# Patient Record
Sex: Male | Born: 1965 | Race: White | Hispanic: No | Smoking: Current every day smoker
Health system: Southern US, Community
[De-identification: ages and names within clinical notes are randomized; demographics above are authoritative.]

## PROBLEM LIST (undated history)

## (undated) ENCOUNTER — Ambulatory Visit

## (undated) DIAGNOSIS — E039 Hypothyroidism, unspecified: Secondary | ICD-10-CM

## (undated) DIAGNOSIS — K219 Gastro-esophageal reflux disease without esophagitis: Secondary | ICD-10-CM

## (undated) DIAGNOSIS — E079 Disorder of thyroid, unspecified: Secondary | ICD-10-CM

## (undated) HISTORY — PX: KNEE ARTHROSCOPY: SHX127

## (undated) HISTORY — DX: Disorder of thyroid, unspecified: E07.9

## (undated) HISTORY — PX: HERNIA REPAIR: SHX51

## (undated) HISTORY — PX: KNEE RECONSTRUCTION: SHX5883

## (undated) HISTORY — PX: RECONSTRUCTION OF NOSE: SHX2301

---

## 1998-07-06 ENCOUNTER — Emergency Department (HOSPITAL_COMMUNITY): Admission: EM | Admit: 1998-07-06 | Discharge: 1998-07-06 | Payer: Self-pay | Admitting: Family Medicine

## 1998-07-17 ENCOUNTER — Encounter: Payer: Self-pay | Admitting: Orthopedic Surgery

## 1998-07-17 ENCOUNTER — Ambulatory Visit (HOSPITAL_COMMUNITY): Admission: RE | Admit: 1998-07-17 | Discharge: 1998-07-17 | Payer: Self-pay | Admitting: Orthopedic Surgery

## 2003-07-22 ENCOUNTER — Emergency Department (HOSPITAL_COMMUNITY): Admission: EM | Admit: 2003-07-22 | Discharge: 2003-07-22 | Payer: Self-pay | Admitting: Emergency Medicine

## 2004-05-14 ENCOUNTER — Emergency Department (HOSPITAL_COMMUNITY): Admission: EM | Admit: 2004-05-14 | Discharge: 2004-05-14 | Payer: Self-pay | Admitting: Emergency Medicine

## 2007-11-10 ENCOUNTER — Emergency Department (HOSPITAL_COMMUNITY): Admission: EM | Admit: 2007-11-10 | Discharge: 2007-11-10 | Payer: Self-pay | Admitting: Family Medicine

## 2012-07-07 ENCOUNTER — Encounter (INDEPENDENT_AMBULATORY_CARE_PROVIDER_SITE_OTHER): Payer: Self-pay | Admitting: General Surgery

## 2012-07-07 ENCOUNTER — Ambulatory Visit (INDEPENDENT_AMBULATORY_CARE_PROVIDER_SITE_OTHER): Payer: Self-pay | Admitting: General Surgery

## 2012-07-07 VITALS — BP 178/84 | HR 68 | Temp 97.3°F | Resp 12 | Ht 71.75 in | Wt 265.0 lb

## 2012-07-07 DIAGNOSIS — K4090 Unilateral inguinal hernia, without obstruction or gangrene, not specified as recurrent: Secondary | ICD-10-CM

## 2012-07-07 NOTE — Patient Instructions (Signed)
Plan for right inguinal hernia repair with mesh 

## 2012-07-07 NOTE — Progress Notes (Signed)
Subjective:     Patient ID: Ian Arnold, male   DOB: 10/03/1965, 46 y.o.   MRN: [id number]  HPI The patient is a 46 year old white male who felt a hernia pop out in his right groin about 8 or 10 years ago. He actually saw Dr. Orson Slick in the past but did not have insurance was not able to get it fixed. Over the last 8-10 years or hernias gradually gotten larger and more painful. He has problems with constipation and has a bowel movement about every 3 days or so. He has nausea but denies any vomiting.  Review of Systems  Constitutional: Positive for diaphoresis.  HENT: Negative.   Eyes: Negative.   Respiratory: Negative.   Cardiovascular: Negative.   Gastrointestinal: Positive for nausea and constipation.  Genitourinary: Positive for testicular pain.  Musculoskeletal: Negative.   Skin: Negative.   Neurological: Negative.   Hematological: Negative.   Psychiatric/Behavioral: Negative.        Objective:   Physical Exam  Constitutional: He is oriented to person, place, and time. He appears well-developed and well-nourished.  HENT:  Head: Normocephalic and atraumatic.  Eyes: Conjunctivae normal and EOM are normal. Pupils are equal, round, and reactive to light.  Neck: Normal range of motion. Neck supple.  Cardiovascular: Normal rate, regular rhythm and normal heart sounds.   Pulmonary/Chest: Effort normal and breath sounds normal.  Abdominal: Soft. Bowel sounds are normal.  Genitourinary:       The patient has a large right inguinal hernia that extends down into his scrotum. It is difficult to reduce and uncomfortable to palpation. His abdomen is nondistended and does not appear to be obstructed.  Musculoskeletal: Normal range of motion.  Neurological: He is alert and oriented to person, place, and time.  Skin: Skin is warm and dry.  Psychiatric: He has a normal mood and affect. His behavior is normal.       Assessment:     The patient has a large right inguinal hernia that involves  bowel and is partially incarcerated. Because of the risk of strangulation of the intestine and no obstruction I think he would benefit from having this fixed. He would also like to have this done. I discussed with him in detail the risks and benefits of the operation to fix the hernia as well as some of the technical aspects including the possibility of injuring the bile as well as possible injury to the vas and loss of the testicle and he understands and wishes to proceed    Plan:     Plan for right inguinal hernia repair with mesh

## 2012-07-08 ENCOUNTER — Other Ambulatory Visit (INDEPENDENT_AMBULATORY_CARE_PROVIDER_SITE_OTHER): Payer: Self-pay | Admitting: General Surgery

## 2012-07-08 DIAGNOSIS — K4090 Unilateral inguinal hernia, without obstruction or gangrene, not specified as recurrent: Secondary | ICD-10-CM

## 2012-07-08 MED ORDER — HYDROCODONE-ACETAMINOPHEN 5-325 MG PO TABS: 1.0000 | ORAL_TABLET | Freq: Four times a day (QID) | ORAL | Status: DC | PRN

## 2012-07-11 ENCOUNTER — Encounter (HOSPITAL_COMMUNITY): Payer: Self-pay | Admitting: Pharmacy Technician

## 2012-07-14 ENCOUNTER — Telehealth (INDEPENDENT_AMBULATORY_CARE_PROVIDER_SITE_OTHER): Payer: Self-pay | Admitting: General Surgery

## 2012-07-14 ENCOUNTER — Encounter (HOSPITAL_COMMUNITY)
Admission: RE | Admit: 2012-07-14 | Discharge: 2012-07-14 | Disposition: A | Discharge status: Home / Self Care | Payer: Self-pay | Source: Ambulatory Visit | Attending: General Surgery | Admitting: General Surgery

## 2012-07-14 ENCOUNTER — Encounter (HOSPITAL_COMMUNITY): Payer: Self-pay

## 2012-07-14 HISTORY — DX: Gastro-esophageal reflux disease without esophagitis: K21.9

## 2012-07-14 HISTORY — DX: Hypothyroidism, unspecified: E03.9

## 2012-07-14 LAB — BASIC METABOLIC PANEL
Calcium: 9.5 mg/dL (ref 8.4–10.5)
GFR calc Af Amer: >90 mL/min (ref >90)
GFR calc non Af Amer: >90 mL/min (ref >90)
Glucose, Bld: 100 mg/dL — ABNORMAL HIGH (ref 70–99)
Potassium: 4.8 mEq/L (ref 3.5–5.1)
Sodium: 141 mEq/L (ref 135–145)

## 2012-07-14 LAB — SURGICAL PCR SCREEN
MRSA, PCR: NEGATIVE
Staphylococcus aureus: NEGATIVE

## 2012-07-14 LAB — CBC
MCH: 31.2 pg (ref 26.0–34.0)
MCHC: 34.9 g/dL (ref 30.0–36.0)
Platelets: 255 10*3/uL (ref 150–400)
RBC: 4.52 MIL/uL (ref 4.22–5.81)

## 2012-07-14 NOTE — Telephone Encounter (Signed)
Pt's wife called to report that pre-admitting suggested she call our office re pain medication refill since he only had 6 tablets left until surgery on Monday. Pt does not need #30 only enough until surgery. Medication is Hydrocodone 5/325/ Pharmacy is [facility]/ [place]/ [phone]/ thanks/gy

## 2012-07-14 NOTE — Pre-Procedure Instructions (Signed)
20 TAWHID CORSO  07/14/2012   Your procedure is scheduled on:  07-18-2012  Report to [facility] at 9:00 AM.   TAKE EAST ELEVATORS TO THE 3RD FLOOR  Call this number if you have problems the morning of surgery: [phone]   Remember:   Do not eat food or drinkAfter Midnight.      Take these medicines the morning of surgery with A SIP OF WATER: pain medication if needed   Do not wear jewelry  Do not wear lotions, powders, or perfumes.  Do not shave 48 hours prior to surgery. Men may shave face and neck.  Do not bring valuables to the hospital.  Contacts, dentures or bridgework may not be worn into surgery.  Leave suitcase in the car. After surgery it may be brought to your room.   For patients admitted to the hospital, checkout time is 11:00 AM the day of discharge.   Patients discharged the day of surgery will not be allowed to drive home.  Name and phone number of your driver: ____________________    Special Instructions: Shower using CHG 2 nights before surgery and the night before surgery.  If you shower the day of surgery use CHG.  Use special wash - you have one bottle of CHG for all showers.  You should use approximately 1/3 of the bottle for each shower.     Please read over the following fact sheets that you were given: Pain Booklet, Coughing and Deep Breathing, MRSA Information and Surgical Site Infection Prevention

## 2012-07-15 ENCOUNTER — Telehealth (INDEPENDENT_AMBULATORY_CARE_PROVIDER_SITE_OTHER): Payer: Self-pay | Admitting: General Surgery

## 2012-07-15 ENCOUNTER — Other Ambulatory Visit (INDEPENDENT_AMBULATORY_CARE_PROVIDER_SITE_OTHER): Payer: Self-pay | Admitting: General Surgery

## 2012-07-15 DIAGNOSIS — K4090 Unilateral inguinal hernia, without obstruction or gangrene, not specified as recurrent: Secondary | ICD-10-CM

## 2012-07-15 MED ORDER — HYDROCODONE-ACETAMINOPHEN 5-325 MG PO TABS: 1.0000 | ORAL_TABLET | Freq: Four times a day (QID) | ORAL | Status: DC | PRN

## 2012-07-15 NOTE — Telephone Encounter (Signed)
He can have 20 more norcos

## 2012-07-15 NOTE — Telephone Encounter (Signed)
LMOM for refill of NORCO 5-325mg  1 tablet Q6H prn, #20 no refills.

## 2012-07-17 MED ORDER — CHLORHEXIDINE GLUCONATE 4 % EX LIQD: 1.0000 "application " | Freq: Once | CUTANEOUS | Status: DC

## 2012-07-17 MED ORDER — DEXTROSE 5 % IV SOLN
3.0000 g | INTRAVENOUS | Status: AC
  Administered 2012-07-18: 3 g via INTRAVENOUS
  Filled 2012-07-17: qty 3000

## 2012-07-18 ENCOUNTER — Encounter (HOSPITAL_COMMUNITY): Payer: Self-pay | Admitting: Anesthesiology

## 2012-07-18 ENCOUNTER — Encounter (HOSPITAL_COMMUNITY)
Admission: RE | Disposition: A | Discharge status: Home / Self Care | Payer: Self-pay | Source: Ambulatory Visit | Attending: General Surgery

## 2012-07-18 ENCOUNTER — Ambulatory Visit (HOSPITAL_COMMUNITY)
Admission: RE | Admit: 2012-07-18 | Discharge: 2012-07-18 | Disposition: A | Discharge status: Home / Self Care | Payer: Self-pay | Source: Ambulatory Visit | Attending: General Surgery | Admitting: General Surgery

## 2012-07-18 ENCOUNTER — Encounter (HOSPITAL_COMMUNITY): Payer: Self-pay | Admitting: *Deleted

## 2012-07-18 ENCOUNTER — Ambulatory Visit (HOSPITAL_COMMUNITY): Payer: Self-pay | Admitting: Anesthesiology

## 2012-07-18 DIAGNOSIS — K4090 Unilateral inguinal hernia, without obstruction or gangrene, not specified as recurrent: Secondary | ICD-10-CM | POA: Insufficient documentation

## 2012-07-18 DIAGNOSIS — Z01812 Encounter for preprocedural laboratory examination: Secondary | ICD-10-CM | POA: Insufficient documentation

## 2012-07-18 HISTORY — PX: INGUINAL HERNIA REPAIR: SHX194

## 2012-07-18 HISTORY — PX: INSERTION OF MESH: SHX5868

## 2012-07-18 SURGERY — REPAIR, HERNIA, INGUINAL, ADULT
Anesthesia: General | Site: Abdomen | Laterality: Right | Wound class: Clean

## 2012-07-18 MED ORDER — MORPHINE SULFATE 2 MG/ML IJ SOLN: 2.0000 mg | Freq: Once | INTRAMUSCULAR | Status: DC

## 2012-07-18 MED ORDER — PROMETHAZINE HCL 25 MG/ML IJ SOLN
12.5000 mg | Freq: Once | INTRAMUSCULAR | Status: DC
  Filled 2012-07-18: qty 1

## 2012-07-18 MED ORDER — MORPHINE SULFATE 4 MG/ML IJ SOLN
INTRAMUSCULAR | Status: AC
  Administered 2012-07-18: 2 mg
  Filled 2012-07-18: qty 1

## 2012-07-18 MED ORDER — ROCURONIUM BROMIDE 100 MG/10ML IV SOLN
INTRAVENOUS | Status: DC | PRN
  Administered 2012-07-18: 50 mg via INTRAVENOUS

## 2012-07-18 MED ORDER — ONDANSETRON HCL 4 MG/2ML IJ SOLN
INTRAMUSCULAR | Status: DC | PRN
  Administered 2012-07-18: 4 mg via INTRAVENOUS

## 2012-07-18 MED ORDER — HYDROMORPHONE HCL PF 1 MG/ML IJ SOLN
INTRAMUSCULAR | Status: AC
  Filled 2012-07-18: qty 1

## 2012-07-18 MED ORDER — FENTANYL CITRATE 0.05 MG/ML IJ SOLN
INTRAMUSCULAR | Status: DC | PRN
  Administered 2012-07-18: 100 ug via INTRAVENOUS
  Administered 2012-07-18 (×3): 50 ug via INTRAVENOUS

## 2012-07-18 MED ORDER — PROPOFOL 10 MG/ML IV BOLUS
INTRAVENOUS | Status: DC | PRN
  Administered 2012-07-18: 280 mg via INTRAVENOUS

## 2012-07-18 MED ORDER — 0.9 % SODIUM CHLORIDE (POUR BTL) OPTIME
TOPICAL | Status: DC | PRN
  Administered 2012-07-18: 1000 mL

## 2012-07-18 MED ORDER — GLYCOPYRROLATE 0.2 MG/ML IJ SOLN
INTRAMUSCULAR | Status: DC | PRN
  Administered 2012-07-18: 0.2 mg via INTRAVENOUS
  Administered 2012-07-18: 0.4 mg via INTRAVENOUS

## 2012-07-18 MED ORDER — ONDANSETRON HCL 4 MG/2ML IJ SOLN: 4.0000 mg | Freq: Once | INTRAMUSCULAR | Status: DC | PRN

## 2012-07-18 MED ORDER — LACTATED RINGERS IV SOLN
INTRAVENOUS | Status: DC | PRN
  Administered 2012-07-18 (×2): via INTRAVENOUS

## 2012-07-18 MED ORDER — HYDROMORPHONE HCL PF 1 MG/ML IJ SOLN
0.2500 mg | INTRAMUSCULAR | Status: DC | PRN
  Administered 2012-07-18: 0.5 mg via INTRAVENOUS

## 2012-07-18 MED ORDER — NEOSTIGMINE METHYLSULFATE 1 MG/ML IJ SOLN
INTRAMUSCULAR | Status: DC | PRN
  Administered 2012-07-18: 3 mg via INTRAVENOUS

## 2012-07-18 MED ORDER — LIDOCAINE HCL (CARDIAC) 20 MG/ML IV SOLN
INTRAVENOUS | Status: DC | PRN
  Administered 2012-07-18: 90 mg via INTRAVENOUS

## 2012-07-18 MED ORDER — LACTATED RINGERS IV SOLN
INTRAVENOUS | Status: DC
  Administered 2012-07-18: 10:00:00 via INTRAVENOUS

## 2012-07-18 MED ORDER — BUPIVACAINE-EPINEPHRINE PF 0.25-1:200000 % IJ SOLN
INTRAMUSCULAR | Status: AC
  Filled 2012-07-18: qty 30

## 2012-07-18 MED ORDER — VECURONIUM BROMIDE 10 MG IV SOLR
INTRAVENOUS | Status: DC | PRN
  Administered 2012-07-18: 2 mg via INTRAVENOUS

## 2012-07-18 MED ORDER — HYDROCODONE-ACETAMINOPHEN 5-325 MG PO TABS: 1.0000 | ORAL_TABLET | ORAL | Status: DC | PRN

## 2012-07-18 MED ORDER — BUPIVACAINE-EPINEPHRINE 0.25% -1:200000 IJ SOLN
INTRAMUSCULAR | Status: DC | PRN
  Administered 2012-07-18: 20 mL

## 2012-07-18 SURGICAL SUPPLY — 48 items
ADH SKN CLS APL DERMABOND .7 (GAUZE/BANDAGES/DRESSINGS) ×2
BLADE SURG 10 STRL SS (BLADE) ×3 IMPLANT
BLADE SURG 15 STRL LF DISP TIS (BLADE) ×2 IMPLANT
BLADE SURG 15 STRL SS (BLADE) ×3
BLADE SURG ROTATE 9660 (MISCELLANEOUS) ×1 IMPLANT
CHLORAPREP W/TINT 26ML (MISCELLANEOUS) ×3 IMPLANT
CLOTH BEACON ORANGE TIMEOUT ST (SAFETY) ×3 IMPLANT
COVER SURGICAL LIGHT HANDLE (MISCELLANEOUS) ×3 IMPLANT
DECANTER SPIKE VIAL GLASS SM (MISCELLANEOUS) ×3 IMPLANT
DERMABOND ADVANCED (GAUZE/BANDAGES/DRESSINGS) ×1
DERMABOND ADVANCED .7 DNX12 (GAUZE/BANDAGES/DRESSINGS) ×2 IMPLANT
DRAIN PENROSE 1/2X12 LTX STRL (WOUND CARE) ×1 IMPLANT
DRAPE LAPAROSCOPIC ABDOMINAL (DRAPES) ×3 IMPLANT
DRAPE UTILITY 15X26 W/TAPE STR (DRAPE) ×6 IMPLANT
ELECT CAUTERY BLADE 6.4 (BLADE) ×3 IMPLANT
ELECT REM PT RETURN 9FT ADLT (ELECTROSURGICAL) ×3
ELECTRODE REM PT RTRN 9FT ADLT (ELECTROSURGICAL) ×2 IMPLANT
GLOVE BIO SURGEON STRL SZ7.5 (GLOVE) ×6 IMPLANT
GLOVE BIOGEL PI IND STRL 7.5 (GLOVE) IMPLANT
GLOVE BIOGEL PI INDICATOR 7.5 (GLOVE) ×3
GOWN STRL NON-REIN LRG LVL3 (GOWN DISPOSABLE) ×8 IMPLANT
KIT BASIN OR (CUSTOM PROCEDURE TRAY) ×3 IMPLANT
KIT ROOM TURNOVER OR (KITS) ×3 IMPLANT
MESH ULTRAPRO 3X6 7.6X15CM (Mesh General) ×1 IMPLANT
NDL HYPO 25GX1X1/2 BEV (NEEDLE) ×2 IMPLANT
NEEDLE HYPO 25GX1X1/2 BEV (NEEDLE) ×3 IMPLANT
NS IRRIG 1000ML POUR BTL (IV SOLUTION) ×3 IMPLANT
PACK SURGICAL SETUP 50X90 (CUSTOM PROCEDURE TRAY) ×3 IMPLANT
PAD ARMBOARD 7.5X6 YLW CONV (MISCELLANEOUS) ×6 IMPLANT
PENCIL BUTTON HOLSTER BLD 10FT (ELECTRODE) ×3 IMPLANT
SPONGE LAP 18X18 X RAY DECT (DISPOSABLE) ×3 IMPLANT
SUT MNCRL AB 4-0 PS2 18 (SUTURE) ×3 IMPLANT
SUT PROLENE 2 0 SH DA (SUTURE) ×8 IMPLANT
SUT SILK 2 0 SH (SUTURE) IMPLANT
SUT SILK 3 0 (SUTURE) ×3
SUT SILK 3-0 18XBRD TIE 12 (SUTURE) ×2 IMPLANT
SUT VIC AB 0 CT1 27 (SUTURE) ×3
SUT VIC AB 0 CT1 27XBRD ANBCTR (SUTURE) ×2 IMPLANT
SUT VIC AB 2-0 SH 27 (SUTURE) ×6
SUT VIC AB 2-0 SH 27X BRD (SUTURE) ×2 IMPLANT
SUT VIC AB 2-0 SH 27XBRD (SUTURE) IMPLANT
SUT VIC AB 3-0 SH 27 (SUTURE) ×3
SUT VIC AB 3-0 SH 27XBRD (SUTURE) ×2 IMPLANT
SYR BULB 3OZ (MISCELLANEOUS) ×3 IMPLANT
SYR CONTROL 10ML LL (SYRINGE) ×3 IMPLANT
TOWEL OR 17X24 6PK STRL BLUE (TOWEL DISPOSABLE) ×3 IMPLANT
TOWEL OR 17X26 10 PK STRL BLUE (TOWEL DISPOSABLE) ×3 IMPLANT
WATER STERILE IRR 1000ML POUR (IV SOLUTION) IMPLANT

## 2012-07-18 NOTE — Op Note (Signed)
07/18/2012  12:44 PM  PATIENT:  Ian Arnold  46 y.o. male  PRE-OPERATIVE DIAGNOSIS:  right inguinal hernia  POST-OPERATIVE DIAGNOSIS:  right inguinal hernia  PROCEDURE:  Procedure(s) (LRB) with comments: HERNIA REPAIR INGUINAL ADULT (Right) INSERTION OF MESH (N/A)  SURGEON:  Surgeon(s) and Role:    * Robyne Askew, MD - Primary  PHYSICIAN ASSISTANT:   ASSISTANTS: none   ANESTHESIA:   general  EBL:  Total I/O In: 1100 [I.V.:1100] Out: 50 [Blood:50]  BLOOD ADMINISTERED:none  DRAINS: none   LOCAL MEDICATIONS USED:  MARCAINE     SPECIMEN:  No Specimen  DISPOSITION OF SPECIMEN:  N/A  COUNTS:  YES  TOURNIQUET:  * No tourniquets in log *  DICTATION: .Dragon Dictation After informed consent was obtained the patient was brought to the operating room placed in the supine position on the operating room table. After adequate induction of general anesthesia the patient's right groin was prepped with ChloraPrep, allowed to dry, and draped in usual sterile manner. The right groin was then infiltrated with quarter percent Marcaine. An incision was made from the edge of the pubic tubercle on the right towards the anterior superior iliac spine with a 15 blade knife. This incision was carried through the skin and subcutaneous tissue sharply with the electrocautery until the fascia of the external oblique was encountered. A small bridging vein was clamped with hemostats divided and ligated with 3-0 silk ties. The patient had a very large hernia that extended down into the scrotum. This was able to be reduced prior to starting the case. The external oblique fascia was then opened along its fibers towards the apex of the external ring with a 15 blade knife and Metzenbaum scissors. A wheatland retractor was deployed. Blunt dissection was carried out over the cord structures and the hernia until they could be surrounded between 2 fingers. The ilioinguinal nerve was identified and involved with  scar tissue. It was dissected free proximally and distally clamped with a hemostat divided and ligated with 3-0 silk ties. The large hernia sac was gently separated from the rest of the cord structures. The sac was very large and basically obliterated the whole floor of the canal. Because we were unsure if there was colon within the hernia sac we elected to reduce it. We chose a 3 x 6 piece of ultra pro mesh. The mesh was cut to fit the hernia defect. The mesh was sewed inferiorly to the shelving edge of the inguinal ligament with a running 2-0 Prolene stitch. Superiorly the mesh was sewed to the musculoaponeurotic strength layer of the transversalis with interrupted 2-0 Prolene vertical mattress stitches. Tails were cut in the mesh laterally and the tails were wrapped around the cord structures. Lateral to the cord the tails of the mesh were anchored to the shelving edge of the inguinal ligament with an interrupted 2-0 Prolene stitch. Once this was accomplished the mesh appeared to be in good position and the hernia appeared to be well repaired. The wound was irrigated with copious amounts of saline. The external oblique fascia was then reapproximated with a running 2-0 Vicryl stitch. The subcutaneous fascia was also reapproximated with a running 2-0 Vicryl stitch. The skin was then closed with a running 4-0 Monocryl subcuticular stitch. Dermabond dressings were applied. The patient tolerated the procedure well. At the end of the case all needle sponge and instrument counts were correct. The patient was then awakened and taken to recovery in stable condition. The patient's  testicles in the scrotum at the end of the case.  PLAN OF CARE: Discharge to home after PACU  PATIENT DISPOSITION:  PACU - hemodynamically stable.   Delay start of Pharmacological VTE agent (>24hrs) due to surgical blood loss or risk of bleeding: not applicable

## 2012-07-18 NOTE — Progress Notes (Signed)
Pt reports instance of feeling like" throat closes". No swelling or redness noted.  Dr. Feliberto Gottron, Anesthesiologist notified.

## 2012-07-18 NOTE — H&P (View-Only) (Signed)
Subjective:     Patient ID: Ian Arnold, male   DOB: 08/28/1966, 46 y.o.   MRN: [id number]  HPI The patient is a 46-year-old white male who felt a hernia pop out in his right groin about 8 or 10 years ago. He actually saw Dr. Bowman in the past but did not have insurance was not able to get it fixed. Over the last 8-10 years or hernias gradually gotten larger and more painful. He has problems with constipation and has a bowel movement about every 3 days or so. He has nausea but denies any vomiting.  Review of Systems  Constitutional: Positive for diaphoresis.  HENT: Negative.   Eyes: Negative.   Respiratory: Negative.   Cardiovascular: Negative.   Gastrointestinal: Positive for nausea and constipation.  Genitourinary: Positive for testicular pain.  Musculoskeletal: Negative.   Skin: Negative.   Neurological: Negative.   Hematological: Negative.   Psychiatric/Behavioral: Negative.        Objective:   Physical Exam  Constitutional: He is oriented to person, place, and time. He appears well-developed and well-nourished.  HENT:  Head: Normocephalic and atraumatic.  Eyes: Conjunctivae normal and EOM are normal. Pupils are equal, round, and reactive to light.  Neck: Normal range of motion. Neck supple.  Cardiovascular: Normal rate, regular rhythm and normal heart sounds.   Pulmonary/Chest: Effort normal and breath sounds normal.  Abdominal: Soft. Bowel sounds are normal.  Genitourinary:       The patient has a large right inguinal hernia that extends down into his scrotum. It is difficult to reduce and uncomfortable to palpation. His abdomen is nondistended and does not appear to be obstructed.  Musculoskeletal: Normal range of motion.  Neurological: He is alert and oriented to person, place, and time.  Skin: Skin is warm and dry.  Psychiatric: He has a normal mood and affect. His behavior is normal.       Assessment:     The patient has a large right inguinal hernia that involves  bowel and is partially incarcerated. Because of the risk of strangulation of the intestine and no obstruction I think he would benefit from having this fixed. He would also like to have this done. I discussed with him in detail the risks and benefits of the operation to fix the hernia as well as some of the technical aspects including the possibility of injuring the bile as well as possible injury to the vas and loss of the testicle and he understands and wishes to proceed    Plan:     Plan for right inguinal hernia repair with mesh      

## 2012-07-18 NOTE — Interval H&P Note (Signed)
History and Physical Interval Note:  07/18/2012 10:12 AM  Ian Arnold  has presented today for surgery, with the diagnosis of right  inguinal hernia  The various methods of treatment have been discussed with the patient and family. After consideration of risks, benefits and other options for treatment, the patient has consented to  Procedure(s) (LRB) with comments: HERNIA REPAIR INGUINAL ADULT (Right) INSERTION OF MESH (N/A) as a surgical intervention .  The patient's history has been reviewed, patient examined, no change in status, stable for surgery.  I have reviewed the patient's chart and labs.  Questions were answered to the patient's satisfaction.     TOTH III,PAUL S

## 2012-07-18 NOTE — Anesthesia Procedure Notes (Signed)
Procedure Name: Intubation Date/Time: 07/18/2012 10:56 AM Performed by: Jerilee Hoh Pre-anesthesia Checklist: Patient identified, Emergency Drugs available, Suction available and Patient being monitored Patient Re-evaluated:Patient Re-evaluated prior to inductionOxygen Delivery Method: Circle system utilized Preoxygenation: Pre-oxygenation with 100% oxygen Intubation Type: IV induction Ventilation: Mask ventilation without difficulty and Oral airway inserted - appropriate to patient size Laryngoscope Size: Mac and 4 Grade View: Grade II Tube type: Oral Tube size: 7.5 mm Number of attempts: 1 Airway Equipment and Method: Stylet Placement Confirmation: ETT inserted through vocal cords under direct vision,  positive ETCO2 and breath sounds checked- equal and bilateral Secured at: 23 cm Tube secured with: Tape Dental Injury: Teeth and Oropharynx as per pre-operative assessment

## 2012-07-18 NOTE — Anesthesia Postprocedure Evaluation (Signed)
  Anesthesia Post-op Note  Patient: Ian Arnold  Procedure(s) Performed: Procedure(s) (LRB) with comments: HERNIA REPAIR INGUINAL ADULT (Right) INSERTION OF MESH (N/A)  Patient Location: PACU  Anesthesia Type:General  Level of Consciousness: awake, alert , oriented and patient cooperative  Airway and Oxygen Therapy: Patient Spontanous Breathing  Post-op Pain: mild  Post-op Assessment: Post-op Vital signs reviewed, Patient's Cardiovascular Status Stable, Respiratory Function Stable, Patent Airway, No signs of Nausea or vomiting and Pain level controlled  Post-op Vital Signs: stable  Complications: No apparent anesthesia complications

## 2012-07-18 NOTE — Transfer of Care (Signed)
Immediate Anesthesia Transfer of Care Note  Patient: Ian Arnold  Procedure(s) Performed: Procedure(s) (LRB) with comments: HERNIA REPAIR INGUINAL ADULT (Right) INSERTION OF MESH (N/A)  Patient Location: PACU  Anesthesia Type:General  Level of Consciousness: awake, alert , oriented and patient cooperative  Airway & Oxygen Therapy: Patient Spontanous Breathing and Patient connected to nasal cannula oxygen  Post-op Assessment: Report given to PACU RN, Post -op Vital signs reviewed and stable and Patient moving all extremities  Post vital signs: Reviewed and stable  Complications: No apparent anesthesia complications

## 2012-07-18 NOTE — Preoperative (Signed)
Beta Blockers   Reason not to administer Beta Blockers:Not Applicable 

## 2012-07-18 NOTE — Anesthesia Preprocedure Evaluation (Signed)
Anesthesia Evaluation  Patient identified by MRN, date of birth, ID band Patient awake    Reviewed: Allergy & Precautions, H&P , NPO status , Patient's Chart, lab work & pertinent test results  Airway Mallampati: I TM Distance: >3 FB Neck ROM: full    Dental   Pulmonary former smoker,          Cardiovascular Exercise Tolerance: Good Rhythm:regular Rate:Normal     Neuro/Psych    GI/Hepatic GERD-  ,  Endo/Other  Hypothyroidism   Renal/GU      Musculoskeletal   Abdominal   Peds  Hematology   Anesthesia Other Findings   Reproductive/Obstetrics                           Anesthesia Physical Anesthesia Plan  ASA: I  Anesthesia Plan: General   Post-op Pain Management:    Induction: Intravenous  Airway Management Planned: LMA and Oral ETT  Additional Equipment:   Intra-op Plan:   Post-operative Plan: Extubation in OR  Informed Consent: I have reviewed the patients History and Physical, chart, labs and discussed the procedure including the risks, benefits and alternatives for the proposed anesthesia with the patient or authorized representative who has indicated his/her understanding and acceptance.     Plan Discussed with: CRNA, Anesthesiologist and Surgeon  Anesthesia Plan Comments:         Anesthesia Quick Evaluation

## 2012-07-19 ENCOUNTER — Encounter (HOSPITAL_COMMUNITY): Payer: Self-pay | Admitting: General Surgery

## 2012-07-22 ENCOUNTER — Telehealth (INDEPENDENT_AMBULATORY_CARE_PROVIDER_SITE_OTHER): Payer: Self-pay | Admitting: General Surgery

## 2012-07-22 NOTE — Telephone Encounter (Signed)
Pt called to ask about swelling of scrotum and penis following RIH.  Reassured pt that while unsightly, the swelling is expected.  Reminded him to use ice packs and elevate external genitalia to promote drainage of the tissue fluids.  Gave pt his follow up appt for Tues, 08/02/12 at 11:00.  He understands.

## 2012-08-02 ENCOUNTER — Ambulatory Visit (INDEPENDENT_AMBULATORY_CARE_PROVIDER_SITE_OTHER): Payer: Self-pay | Admitting: General Surgery

## 2012-08-02 ENCOUNTER — Encounter (INDEPENDENT_AMBULATORY_CARE_PROVIDER_SITE_OTHER): Payer: Self-pay | Admitting: General Surgery

## 2012-08-02 VITALS — BP 162/96 | HR 76 | Temp 97.8°F | Resp 18 | Ht 71.0 in | Wt 264.2 lb

## 2012-08-02 DIAGNOSIS — K4090 Unilateral inguinal hernia, without obstruction or gangrene, not specified as recurrent: Secondary | ICD-10-CM

## 2012-08-02 MED ORDER — NYSTATIN 100000 UNIT/GM EX CREA: TOPICAL_CREAM | Freq: Two times a day (BID) | CUTANEOUS | Status: DC

## 2012-08-02 NOTE — Progress Notes (Signed)
Subjective:     Patient ID: Ian Arnold, male   DOB: February 25, 1966, 46 y.o.   MRN: [id number]  HPI The patient is a 46 year old white male who is 2 weeks status post right inguinal hernia repair with mesh. He had a huge hernia. He is still having a lot of pain in the right groin area. He also complains of itching in the right groin area. He definitely feels better than he did prior to surgery. His appetite is good and his bowels are beginning to work more normally now.  Review of Systems     Objective:   Physical Exam On exam his right inguinal incision is healing nicely with no sign of infection. He has a normal healing ridge. He does have some swelling in the scrotum as well which is not expected given the size of his hernia. There is no palpable evidence for recurrence of the hernia. He also has a fungal infection in his right groin crease.    Assessment:     2 weeks status post right inguinal hernia repair with mesh    Plan:     At this point I would like him to continue to refrain from any heavy lifting. I will refill his pain medicine the day. I will also prescribe some nystatin cream for the infection in the right groin. We will plan to see him back in one month to check his progress

## 2012-08-02 NOTE — Patient Instructions (Signed)
No heavy lifting Nystatin cream to rash 2-3 times a day

## 2012-08-12 ENCOUNTER — Other Ambulatory Visit (INDEPENDENT_AMBULATORY_CARE_PROVIDER_SITE_OTHER): Payer: Self-pay | Admitting: General Surgery

## 2012-08-12 ENCOUNTER — Encounter (INDEPENDENT_AMBULATORY_CARE_PROVIDER_SITE_OTHER): Payer: Self-pay | Admitting: General Surgery

## 2012-08-12 ENCOUNTER — Ambulatory Visit (INDEPENDENT_AMBULATORY_CARE_PROVIDER_SITE_OTHER): Payer: Self-pay | Admitting: General Surgery

## 2012-08-12 VITALS — BP 128/82 | HR 76 | Temp 98.0°F | Resp 12 | Ht 71.0 in | Wt 264.2 lb

## 2012-08-12 DIAGNOSIS — N5089 Other specified disorders of the male genital organs: Secondary | ICD-10-CM

## 2012-08-12 DIAGNOSIS — N50812 Left testicular pain: Secondary | ICD-10-CM

## 2012-08-12 DIAGNOSIS — N50811 Right testicular pain: Secondary | ICD-10-CM

## 2012-08-12 MED ORDER — HYDROCODONE-ACETAMINOPHEN 5-325 MG PO TABS: 1.0000 | ORAL_TABLET | ORAL | Status: DC | PRN

## 2012-08-12 NOTE — Progress Notes (Signed)
Subjective:   Patient ID: Ian Arnold, male DOB: 29-May-1966, 46 y.o. MRN: [id number]  HPI  The patient is a 46 year old white male who is ~3 weeks status post right inguinal hernia repair with mesh. He had a large hernia per Dr Carolynne Edouard. He is still having a lot of pain and swelling in the right groin area.  He trying to elevate the testicle but the pain is still there.    Review of Systems  Objective:   Physical Exam  Filed Vitals:   08/12/12 1635  BP: 128/82  Pulse: 76  Temp: 98 F (36.7 C)  Resp: 12   On exam his right inguinal incision is healing with no sign of infection. He does have some swelling in his R testicle.  It is about 3 times as large and the left and tender to touch    Assessment:   3 weeks status post right inguinal hernia repair with mesh with a swollen R testicle- presumed venous drainage issue.  Pain and swelling not acute therefore these will be scheduled urgently but not emergently. Scrotal US and follow up with Urology Cont to elevate scrotum Keep apt to see Dr Carolynne Edouard in a couple weeks

## 2012-08-12 NOTE — Patient Instructions (Addendum)
You have a swollen right testicle.  I have recommended a scrotal ultrasound and to see a urologist

## 2012-09-06 ENCOUNTER — Ambulatory Visit (INDEPENDENT_AMBULATORY_CARE_PROVIDER_SITE_OTHER): Payer: Self-pay | Admitting: General Surgery

## 2012-09-06 ENCOUNTER — Encounter (INDEPENDENT_AMBULATORY_CARE_PROVIDER_SITE_OTHER): Payer: Self-pay | Admitting: General Surgery

## 2012-09-06 VITALS — BP 152/70 | HR 76 | Temp 98.3°F | Resp 20 | Ht 72.0 in | Wt 262.0 lb

## 2012-09-06 DIAGNOSIS — K4090 Unilateral inguinal hernia, without obstruction or gangrene, not specified as recurrent: Secondary | ICD-10-CM

## 2012-09-06 NOTE — Patient Instructions (Signed)
May return to all normal activities 

## 2012-09-06 NOTE — Progress Notes (Signed)
Subjective:     Patient ID: Ian Arnold, male   DOB: [birth year], 47 y.o.   MRN: [id number]  HPI The patient is a 47 year old white male who is about 6 weeks status post right inguinal hernia repair with mesh. He had a very large hernia that extended into his scrotum. His only complaint today is of some swelling and soreness around the right testicle. Overall though this is vastly improved from what he was experiencing preoperatively.  Review of Systems     Objective:   Physical Exam On exam his right groin incision is healing nicely with no sign of infection. He has no palpable evidence for recurrence of the hernia. He has some mild fullness around the right testicle but this is actually much less than what I expected. The tenderness around the right testicle was very mild.    Assessment:     6 weeks status post right inguinal hernia repair with mesh    Plan:     At this point I believe he can return to his normal activities without any restrictions. I'll plan to see him back in one month to recheck his scrotum. If he continues to have some soreness we can get an ultrasound of the area but I think this is all normal postoperative findings at this point

## 2012-10-04 ENCOUNTER — Encounter (INDEPENDENT_AMBULATORY_CARE_PROVIDER_SITE_OTHER): Payer: Self-pay | Admitting: General Surgery

## 2012-10-04 ENCOUNTER — Ambulatory Visit (INDEPENDENT_AMBULATORY_CARE_PROVIDER_SITE_OTHER): Payer: Self-pay | Admitting: General Surgery

## 2012-10-04 VITALS — BP 126/80 | HR 61 | Temp 97.0°F | Resp 18 | Ht 71.38 in | Wt 262.4 lb

## 2012-10-04 DIAGNOSIS — K4090 Unilateral inguinal hernia, without obstruction or gangrene, not specified as recurrent: Secondary | ICD-10-CM

## 2012-10-04 NOTE — Progress Notes (Signed)
Subjective:     Patient ID: Ian Arnold, male   DOB: Aug 26, 1966, 47 y.o.   MRN: [id number]  HPI The patient is a 47 year old white male who is about 6 weeks status post repair of a giant right inguinal hernia with mesh. He is still having a little bit of burning sensation at the lateral portion of his incision but this is improving. His appetite is good and his bowels are working normally. The swelling around his right testicle is significantly reduced.  Review of Systems     Objective:   Physical Exam On exam his right inguinal incision is healing nicely with no sign of infection or seroma. There is no palpable evidence for recurrence of the hernia. The swelling around the testicle is significantly reduced.    Assessment:     The patient is 6 weeks status post repair of a giant right inguinal hernia    Plan:     At this point I think he can start returning to his normal activities without restrictions. We will plan to see him back in a couple more months to check his progress

## 2012-10-04 NOTE — Patient Instructions (Signed)
May return to normal activities 

## 2012-12-05 ENCOUNTER — Encounter (INDEPENDENT_AMBULATORY_CARE_PROVIDER_SITE_OTHER): Payer: Self-pay | Admitting: General Surgery

## 2012-12-05 ENCOUNTER — Ambulatory Visit (INDEPENDENT_AMBULATORY_CARE_PROVIDER_SITE_OTHER): Payer: Self-pay | Admitting: General Surgery

## 2012-12-05 VITALS — BP 144/82 | HR 58 | Temp 98.2°F | Resp 18 | Ht 71.5 in | Wt 259.8 lb

## 2012-12-05 DIAGNOSIS — K4090 Unilateral inguinal hernia, without obstruction or gangrene, not specified as recurrent: Secondary | ICD-10-CM

## 2012-12-05 NOTE — Patient Instructions (Signed)
May return to all normal activities 

## 2012-12-05 NOTE — Progress Notes (Signed)
Subjective:     Patient ID: Ian Arnold, male   DOB: 1966/02/25, 47 y.o.   MRN: [id number]  HPI The patient is a 47 year old white male who is 3 months status post repair of a giant right inguinal hernia with mesh. At this point he is doing very well. The swelling in his testicle has resolved. He denies any groin pain. Appetite is good and his bowels are working normally.  Review of Systems     Objective:   Physical Exam On exam his right groin incision is healed nicely with no sign of infection or seroma. There is no palpable evidence for recurrence of the hernia.    Assessment:     The patient is 3 months status post right inguinal hernia repair with mesh     Plan:     At this point he may return all his normal activities without any restrictions. We will plan to see him back on a when necessary basis

## 2014-09-22 ENCOUNTER — Emergency Department (HOSPITAL_COMMUNITY): Payer: Self-pay

## 2014-09-22 ENCOUNTER — Emergency Department (HOSPITAL_COMMUNITY)
Admission: EM | Admit: 2014-09-22 | Discharge: 2014-09-22 | Disposition: A | Discharge status: Home / Self Care | Payer: Self-pay | Attending: Emergency Medicine | Admitting: Emergency Medicine

## 2014-09-22 ENCOUNTER — Encounter (HOSPITAL_COMMUNITY): Payer: Self-pay | Admitting: *Deleted

## 2014-09-22 DIAGNOSIS — Y9289 Other specified places as the place of occurrence of the external cause: Secondary | ICD-10-CM | POA: Insufficient documentation

## 2014-09-22 DIAGNOSIS — Z87891 Personal history of nicotine dependence: Secondary | ICD-10-CM | POA: Insufficient documentation

## 2014-09-22 DIAGNOSIS — M25531 Pain in right wrist: Secondary | ICD-10-CM

## 2014-09-22 DIAGNOSIS — Z8719 Personal history of other diseases of the digestive system: Secondary | ICD-10-CM | POA: Insufficient documentation

## 2014-09-22 DIAGNOSIS — S62144A Nondisplaced fracture of body of hamate [unciform] bone, right wrist, initial encounter for closed fracture: Secondary | ICD-10-CM | POA: Insufficient documentation

## 2014-09-22 DIAGNOSIS — Y998 Other external cause status: Secondary | ICD-10-CM | POA: Insufficient documentation

## 2014-09-22 DIAGNOSIS — R52 Pain, unspecified: Secondary | ICD-10-CM

## 2014-09-22 DIAGNOSIS — W1839XA Other fall on same level, initial encounter: Secondary | ICD-10-CM | POA: Insufficient documentation

## 2014-09-22 DIAGNOSIS — S62141A Displaced fracture of body of hamate [unciform] bone, right wrist, initial encounter for closed fracture: Secondary | ICD-10-CM

## 2014-09-22 DIAGNOSIS — Z8639 Personal history of other endocrine, nutritional and metabolic disease: Secondary | ICD-10-CM | POA: Insufficient documentation

## 2014-09-22 DIAGNOSIS — Y9389 Activity, other specified: Secondary | ICD-10-CM | POA: Insufficient documentation

## 2014-09-22 MED ORDER — TRAMADOL HCL 50 MG PO TABS: 50.0000 mg | ORAL_TABLET | Freq: Four times a day (QID) | ORAL | Status: DC | PRN

## 2014-09-22 MED ORDER — ACETAMINOPHEN 325 MG PO TABS
650.0000 mg | ORAL_TABLET | Freq: Once | ORAL | Status: AC
  Administered 2014-09-22: 650 mg via ORAL
  Filled 2014-09-22: qty 2

## 2014-09-22 NOTE — ED Notes (Signed)
Pt reports falling today and has pain to right hand

## 2014-09-22 NOTE — ED Notes (Signed)
Ortho Tech in w/pt. 

## 2014-09-22 NOTE — Discharge Instructions (Signed)
Take pain medication as needed.  Do not drive or operate heavy machinery for 4-6 hours after taking medication. °

## 2014-09-22 NOTE — ED Provider Notes (Signed)
CSN: [id number]     Arrival date & time 09/22/14  1415 History  This chart was scribed for non-physician practitioner, Santiago GladHeather Kolina Kube, PA-C working with Flint MelterElliott L Wentz, MD by Luisa DagoPriscilla Tutu, ED scribe. This patient was seen in room TR11C/TR11C and the patient's care was started at 2:50 PM.     Chief Complaint  Patient presents with  . Fall  . Hand Pain   The history is provided by the patient and medical records. No language interpreter was used.   HPI Comments: Ian Arnold is a 49 y.o. male who presents to the Emergency Department complaining of a fall that occurred today approximately 3 hours ago. Pt states that he was helping a person push their car out of the snow when he fell back. He states that he tried to catch himself with his right hand but landed on it wrongly. Denies hitting his head or LOC.  He endorses associated pain and mild swelling. Pt is right hand dominant. Denies taking any OTC medication to relieve his pain. Pt denies neck pain, numbness, or tingling.    Past Medical History  Diagnosis Date  . Thyroid disease   . Hypothyroidism     has never been on medication  . GERD (gastroesophageal reflux disease)     tums   Past Surgical History  Procedure Laterality Date  . Knee reconstruction    . Knee arthroscopy    . Reconstruction of nose    . Inguinal hernia repair  07/18/2012    Procedure: HERNIA REPAIR INGUINAL ADULT;  Surgeon: Robyne AskewPaul S Toth III, MD;  [facility];  Service: General;  Laterality: Right;  . Insertion of mesh  07/18/2012    Procedure: INSERTION OF MESH;  Surgeon: Robyne AskewPaul S Toth III, MD;  [facility];  Service: General;  Laterality: N/A;  . Hernia repair     History reviewed. No pertinent family history. History  Substance Use Topics  . Smoking status: Former Smoker -- 1.50 packs/day for 14 years    Types: Cigarettes    Start date: 08/31/1994    Quit date: 08/31/1994  . Smokeless tobacco: Not on file  . Alcohol Use: No    Review of  Systems  Constitutional: Negative for fever.  Musculoskeletal: Positive for joint swelling and arthralgias. Negative for myalgias.  Neurological: Negative for weakness and numbness.      Allergies  Measles virus vaccine  Home Medications   Prior to Admission medications   Not on File   BP 135/88 mmHg  Pulse 93  Temp(Src) 98.1 F (36.7 C) (Oral)  Resp 18  Ht 5\' 11"  (1.803 m)  Wt 240 lb (108.863 kg)  BMI 33.49 kg/m2  SpO2 95%  Physical Exam  Constitutional: He is oriented to person, place, and time. He appears well-developed and well-nourished. No distress.  HENT:  Head: Normocephalic and atraumatic.  Eyes: Conjunctivae and EOM are normal.  Neck: Neck supple.  Cardiovascular: Normal rate, regular rhythm and normal heart sounds.   Pulses:      Radial pulses are 2+ on the right side.  Pulmonary/Chest: Effort normal and breath sounds normal. No respiratory distress.  Musculoskeletal: Normal range of motion.  Diffuse swelling over dorsal apsect of the right hand. Diffuse tenderness to palpation of 1-5th metacarpals of the right hand. Pain with motion of the right wrist.  No snuffbox tenderness  Neurological: He is alert and oriented to person, place, and time.  Distal sensation to all fingers of the right hand  is intact.  Skin: Skin is warm and dry.  Psychiatric: He has a normal mood and affect. His behavior is normal.  Nursing note and vitals reviewed.   ED Course  Procedures (including critical care time)  DIAGNOSTIC STUDIES: Oxygen Saturation is 95% on RA, low by my interpretation.    COORDINATION OF CARE: 2:55 PM- Pt advised of plan for treatment and pt agrees.  Imaging Review Dg Wrist Complete Right  09/22/2014   CLINICAL DATA:  Wrist pain after fall  EXAM: RIGHT WRIST - COMPLETE 3+ VIEW  COMPARISON:  None.  FINDINGS: There is no evidence of fracture or dislocation. There is no evidence of arthropathy or other focal bone abnormality. Soft tissues are  unremarkable.  IMPRESSION: Negative.   Electronically Signed   By: Signa Kell M.D.   On: 09/22/2014 15:23   Dg Hand Complete Right  09/22/2014   CLINICAL DATA:  Right hand pain post fall  EXAM: RIGHT HAND - COMPLETE 3+ VIEW  COMPARISON:  None.  FINDINGS: Three views of the right hand submitted. No displaced fracture or subluxation. Question small avulsion fracture of hamate bone on oblique view. Clinical correlation is necessary.  IMPRESSION: No displaced fracture or subluxation. Question small avulsion fracture of hamate bone on oblique view. Clinical correlation is necessary.   Electronically Signed   By: Natasha Mead M.D.   On: 09/22/2014 15:28    MDM   Final diagnoses:  None   Patient presents today with pain of the right hand and right wrist that has been present since falling earlier today.  Xray showing possible avulsion fracture of the hamate bone.  He is neurovascularly intact.  Patient placed in volar splint.  Stable for discharge.  Given referral to Hand Surgery.   Santiago Glad, PA-C 09/22/14 1625  Flint Melter, MD 09/22/14 [phone]

## 2014-09-22 NOTE — Progress Notes (Signed)
Orthopedic Tech Progress Note Patient Details:  Ian Arnold 10/05/1965 [id number]  Ortho Devices Type of Ortho Device: Ace wrap, Volar splint Ortho Device/Splint Location: RUE Ortho Device/Splint Interventions: Ordered, Application   Jennye MoccasinHughes, Mikhaila Roh Craig 09/22/2014, 3:50 PM

## 2016-06-07 ENCOUNTER — Emergency Department (HOSPITAL_COMMUNITY)
Admission: EM | Admit: 2016-06-07 | Discharge: 2016-06-07 | Disposition: A | Discharge status: Home / Self Care | Payer: Self-pay | Attending: Emergency Medicine | Admitting: Emergency Medicine

## 2016-06-07 ENCOUNTER — Encounter (HOSPITAL_COMMUNITY): Payer: Self-pay | Admitting: Emergency Medicine

## 2016-06-07 DIAGNOSIS — Z87891 Personal history of nicotine dependence: Secondary | ICD-10-CM | POA: Insufficient documentation

## 2016-06-07 DIAGNOSIS — K0889 Other specified disorders of teeth and supporting structures: Secondary | ICD-10-CM

## 2016-06-07 DIAGNOSIS — E039 Hypothyroidism, unspecified: Secondary | ICD-10-CM | POA: Insufficient documentation

## 2016-06-07 DIAGNOSIS — K029 Dental caries, unspecified: Secondary | ICD-10-CM | POA: Insufficient documentation

## 2016-06-07 DIAGNOSIS — Z79899 Other long term (current) drug therapy: Secondary | ICD-10-CM | POA: Insufficient documentation

## 2016-06-07 MED ORDER — BUPIVACAINE-EPINEPHRINE (PF) 0.5% -1:200000 IJ SOLN
1.8000 mL | Freq: Once | INTRAMUSCULAR | Status: AC
  Administered 2016-06-07: 1.8 mL
  Filled 2016-06-07: qty 1.8

## 2016-06-07 MED ORDER — PENICILLIN V POTASSIUM 500 MG PO TABS: 500.0000 mg | ORAL_TABLET | Freq: Two times a day (BID) | ORAL | 0 refills | Status: AC

## 2016-06-07 MED ORDER — OXYCODONE-ACETAMINOPHEN 5-325 MG PO TABS: 1.0000 | ORAL_TABLET | ORAL | 0 refills | Status: DC | PRN

## 2016-06-07 NOTE — Discharge Instructions (Signed)
Take medications as prescribed. You may also take 600 mg ibuprofen 4 times daily as needed for pain relief. I recommend eating prior to taking ibuprofen to prevent gastrointestinal side effects. You may apply ice to affected area for 15-20 minutes 3-4 times daily to help with pain. °Follow-up with one of the dental clinics listed below for further management of your dental pain. °Return to the emergency department if symptoms worsen or new onset of fever, headache, neck stiffness, facial/neck swelling, unable to open jaw, unable to swallow resulting in drooling, difficulty breathing, drainage, unable to tolerate fluids.  ° °[employer] °[employer] °[facility] °[address] °[place], [place] [address] °Phone [phone] ° °The [employer] [facility] in [place], [place], exemplifies the Dental School?s vision to improve the health and quality of life of all [place] by creating leaders with a passion to care for the underserved and by leading the nation in community-based, service learning oral health education. ° °We are committed to offering comprehensive general dental services for adults, children and special needs patients in a safe, caring and professional setting. ° ° °Appointments: Our clinic is open Monday through Friday 8:00 a.m. until 5:00 p.m. The amount of time scheduled for an appointment depends on the patient?s specific needs. We ask that you keep your appointed time for care or provide 24-hour notice of all appointment changes. Parents or legal guardians must accompany minor children. °  °Payment for Services: Medicaid and other insurance plans are welcome. Payment for services is due when services are rendered and may be made by cash or credit card. If you have dental insurance, we will assist you with your claim submission. °   °Emergencies:   Emergency services will be provided Monday through Friday on a walk-in basis.  Please arrive early for emergency services. After hours emergency services will be provided for patients of record as required. °  °Services:  °Comprehensive General Dentistry °Children?s Dentistry °Oral Surgery - Extractions °Root Canals °Sealants and Tooth Colored Fillings °Crowns and Bridges °Dentures and Partial Dentures °Implant Services °Periodontal Services and Cleanings °Cosmetic Tooth Whitening °Digital Radiography °3-D/[facility]  °

## 2016-06-07 NOTE — ED Triage Notes (Signed)
Pt c/o dental pain onset Friday night after right upper tooth fractured. Self-treated with ibuprofen and Alleve. Pt states he cannot wait until tomorrow to see a dentist.

## 2016-06-07 NOTE — ED Notes (Signed)
Bed: WTR6 Expected date:  Expected time:  Means of arrival:  Comments: 

## 2016-06-07 NOTE — ED Provider Notes (Signed)
[facility]-EMERGENCY DEPT Provider Note   CSN: [id number] Arrival date & time: 06/07/16  1048     History   Chief Complaint Chief Complaint  Patient presents with  . Dental Pain    HPI Ian Arnold is a 50 y.o. male.  Patient is a 50 year old male with past medical history of reflux and hypothyroidism who presents the ED with complaint of right upper dental pain, onset 3 days. Patient reports while he was eating Friday evening he chipped his right upper tooth and reports having pain since. He states he has been taking ibuprofen and Aleve at home without relief. Pain has worsened over the weekend resulting in him coming to the ED. Denies fever, facial/neck swelling, dysphasia, drooling, trismus, drainage, difficulty breathing. Denies currently having a dentist.      Past Medical History:  Diagnosis Date  . GERD (gastroesophageal reflux disease)    tums  . Hypothyroidism    has never been on medication  . Thyroid disease     Patient Active Problem List   Diagnosis Date Noted  . Right inguinal hernia 07/07/2012    Past Surgical History:  Procedure Laterality Date  . HERNIA REPAIR    . INGUINAL HERNIA REPAIR  07/18/2012   Procedure: HERNIA REPAIR INGUINAL ADULT;  Surgeon: Robyne AskewPaul S Toth III, MD;  [facility];  Service: General;  Laterality: Right;  . INSERTION OF MESH  07/18/2012   Procedure: INSERTION OF MESH;  Surgeon: Robyne AskewPaul S Toth III, MD;  [facility];  Service: General;  Laterality: N/A;  . KNEE ARTHROSCOPY    . KNEE RECONSTRUCTION    . RECONSTRUCTION OF NOSE         Home Medications    Prior to Admission medications   Medication Sig Start Date End Date Taking? Authorizing Provider  Melatonin 1 MG TABS Take 1 tablet by mouth at bedtime as needed.    Historical Provider, MD  oxyCODONE-acetaminophen (PERCOCET/ROXICET) 5-325 MG tablet Take 1 tablet by mouth every 4 (four) hours as needed for severe pain. 06/07/16   Barrett HenleNicole Elizabeth Nadeau, PA-C  penicillin v  potassium (VEETID) 500 MG tablet Take 1 tablet (500 mg total) by mouth 2 (two) times daily. 06/07/16 06/14/16  Barrett HenleNicole Elizabeth Nadeau, PA-C  traMADol (ULTRAM) 50 MG tablet Take 1 tablet (50 mg total) by mouth every 6 (six) hours as needed. 09/22/14   Santiago GladHeather Laisure, PA-C    Family History History reviewed. No pertinent family history.  Social History Social History  Substance Use Topics  . Smoking status: Former Smoker    Packs/day: 1.50    Years: 14.00    Types: Cigarettes    Start date: 08/31/1994    Quit date: 08/31/1994  . Smokeless tobacco: Not on file  . Alcohol use No     Allergies   Measles virus vaccine   Review of Systems Review of Systems  Constitutional: Negative for fever.  HENT: Positive for dental problem. Negative for drooling, facial swelling and trouble swallowing.   Skin: Negative for wound.     Physical Exam Updated Vital Signs BP (!) 157/106 (BP Location: Left Arm)   Pulse (!) 16   Temp 98.1 F (36.7 C) (Oral)   Resp 16   SpO2 99%   Physical Exam  Constitutional: He is oriented to person, place, and time. He appears well-developed and well-nourished.  HENT:  Head: Normocephalic and atraumatic.  Mouth/Throat: Uvula is midline, oropharynx is clear and moist and mucous membranes are normal. No trismus  in the jaw. Abnormal dentition. Dental caries present. No dental abscesses or uvula swelling. No oropharyngeal exudate, posterior oropharyngeal edema, posterior oropharyngeal erythema or tonsillar abscesses. No tonsillar exudate.    Partially chipped tooth #5, TTP. No erythema, swelling, induration, fluctuance or drainage noted to surrounding gingiva. No facial or neck swelling. He should tolerating secretions.  Eyes: Conjunctivae and EOM are normal. Right eye exhibits no discharge. Left eye exhibits no discharge. No scleral icterus.  Neck: Normal range of motion. Neck supple.  Cardiovascular: Normal rate.   Pulmonary/Chest: Effort normal. No stridor.    Neurological: He is alert and oriented to person, place, and time.  Skin: Skin is warm and dry.  Nursing note and vitals reviewed.    ED Treatments / Results  Labs (all labs ordered are listed, but only abnormal results are displayed) Labs Reviewed - No data to display  EKG  EKG Interpretation None       Radiology No results found.  Procedures Dental Block Date/Time: 06/07/2016 11:56 AM Performed by: Barrett Henle Authorized by: Barrett Henle   Consent:    Consent obtained:  Verbal   Consent given by:  Patient   Risks discussed:  Allergic reaction, infection, nerve damage, swelling, hematoma, unsuccessful block and pain Indications:    Indications: dental pain   Location:    Anesthesia block type: right middle superior alveolar. Procedure details (see MAR for exact dosages):    Needle gauge:  27 G   Anesthetic injected:  Bupivacaine 0.5% WITH epi   Injection procedure:  Anatomic landmarks identified Post-procedure details:    Outcome:  Pain relieved   Patient tolerance of procedure:  Tolerated well, no immediate complications    (including critical care time)  Medications Ordered in ED Medications  bupivacaine-epinephrine (MARCAINE W/ EPI) 0.5% -1:200000 injection 1.8 mL (1.8 mLs Infiltration Given 06/07/16 1159)     Initial Impression / Assessment and Plan / ED Course  I have reviewed the triage vital signs and the nursing notes.  Pertinent labs & imaging results that were available during my care of the patient were reviewed by me and considered in my medical decision making (see chart for details).  Clinical Course    Patient with toothache.  No gross abscess.  Exam unconcerning for Ludwig's angina or spread of infection. Dental block preformed without any complications.  Will treat with penicillin and pain medicine.  Urged patient to follow-up with dentist, pt given resources.  Final Clinical Impressions(s) / ED Diagnoses    Final diagnoses:  Pain, dental    New Prescriptions New Prescriptions   OXYCODONE-ACETAMINOPHEN (PERCOCET/ROXICET) 5-325 MG TABLET    Take 1 tablet by mouth every 4 (four) hours as needed for severe pain.   PENICILLIN V POTASSIUM (VEETID) 500 MG TABLET    Take 1 tablet (500 mg total) by mouth 2 (two) times daily.     Satira Sark [place], [place] 06/07/16 1218    Canary Brim Tegeler, MD 06/07/16 [phone]

## 2019-11-15 ENCOUNTER — Ambulatory Visit: Payer: Self-pay | Attending: Internal Medicine

## 2019-11-15 DIAGNOSIS — Z20822 Contact with and (suspected) exposure to covid-19: Secondary | ICD-10-CM | POA: Insufficient documentation

## 2019-11-16 LAB — NOVEL CORONAVIRUS, NAA: SARS-CoV-2, NAA: NOT DETECTED

## 2019-11-17 ENCOUNTER — Telehealth: Payer: Self-pay | Admitting: *Deleted

## 2019-11-17 NOTE — Telephone Encounter (Signed)
Patient called given negative covid results . 

## 2021-05-11 ENCOUNTER — Other Ambulatory Visit: Payer: Self-pay

## 2021-05-11 ENCOUNTER — Encounter (HOSPITAL_COMMUNITY): Payer: Self-pay

## 2021-05-11 ENCOUNTER — Emergency Department (HOSPITAL_COMMUNITY)
Admission: EM | Admit: 2021-05-11 | Discharge: 2021-05-12 | Disposition: A | Discharge status: Home / Self Care | Payer: Self-pay | Attending: Student | Admitting: Student

## 2021-05-11 DIAGNOSIS — J069 Acute upper respiratory infection, unspecified: Secondary | ICD-10-CM | POA: Insufficient documentation

## 2021-05-11 DIAGNOSIS — U071 COVID-19: Secondary | ICD-10-CM | POA: Insufficient documentation

## 2021-05-11 DIAGNOSIS — E039 Hypothyroidism, unspecified: Secondary | ICD-10-CM | POA: Insufficient documentation

## 2021-05-11 DIAGNOSIS — Z87891 Personal history of nicotine dependence: Secondary | ICD-10-CM | POA: Insufficient documentation

## 2021-05-11 DIAGNOSIS — H66003 Acute suppurative otitis media without spontaneous rupture of ear drum, bilateral: Secondary | ICD-10-CM | POA: Insufficient documentation

## 2021-05-11 NOTE — ED Triage Notes (Signed)
Pt reports nasal congestion, and pressure in his ears since Thursday. Covid sx in the household.

## 2021-05-12 LAB — RESP PANEL BY RT-PCR (FLU A&B, COVID) ARPGX2
Influenza A by PCR: NEGATIVE
Influenza B by PCR: NEGATIVE
SARS Coronavirus 2 by RT PCR: POSITIVE — AB

## 2021-05-12 MED ORDER — FLUTICASONE PROPIONATE 50 MCG/ACT NA SUSP: 2.0000 | Freq: Every day | NASAL | 0 refills | Status: DC

## 2021-05-12 MED ORDER — AMOXICILLIN-POT CLAVULANATE 875-125 MG PO TABS: 1.0000 | ORAL_TABLET | Freq: Two times a day (BID) | ORAL | 0 refills | Status: DC

## 2021-05-12 MED ORDER — AMOXICILLIN-POT CLAVULANATE 875-125 MG PO TABS
1.0000 | ORAL_TABLET | Freq: Once | ORAL | Status: AC
  Administered 2021-05-12: 1 via ORAL
  Filled 2021-05-12: qty 1

## 2021-05-12 NOTE — ED Provider Notes (Signed)
[facility]-EMERGENCY DEPT Provider Note   CSN: [id number] Arrival date & time: 05/11/21  2256     History Chief Complaint  Patient presents with   Ear Pain   Nasal Congestion    Ian Arnold is a 55 y.o. male [place] emergency department with bilateral otalgia onset early this afternoon around 2 PM.  Patient reports he has had greater than 1 week of URI Arnold including nasal congestion, cough and sore throat.  Reports that over the last few days mucus in the morning that he coughed up was yellow but then was clear for the rest of the day.  Reports today's mucus has been yellow throughout the entire day.  Reports some decreased hearing as the pain in his ears got worse.  Denies fevers or chills.  Reports negative home COVID test this morning.  No specific alleviating or aggravating factors.  No treatments prior to arrival.  The history is provided by the patient and medical records. No language interpreter was used.      Past Medical History:  Diagnosis Date   GERD (gastroesophageal reflux disease)    tums   Hypothyroidism    has never been on medication   Thyroid disease     Patient Active Problem List   Diagnosis Date Noted   Right inguinal hernia 07/07/2012    Past Surgical History:  Procedure Laterality Date   HERNIA REPAIR     INGUINAL HERNIA REPAIR  07/18/2012   Procedure: HERNIA REPAIR INGUINAL ADULT;  Surgeon: Robyne Askew, MD;  Location: [facility] OR;  Service: General;  Laterality: Right;   INSERTION OF MESH  07/18/2012   Procedure: INSERTION OF MESH;  Surgeon: Robyne Askew, MD;  Location: [facility] OR;  Service: General;  Laterality: N/A;   KNEE ARTHROSCOPY     KNEE RECONSTRUCTION     RECONSTRUCTION OF NOSE         No family history on file.  Social History   Tobacco Use   Smoking status: Former    Packs/day: 1.50    Years: 14.00    Pack years: 21.00    Types: Cigarettes    Start date: 08/30/1994    Quit date: 08/31/1994    Years  since quitting: 26.7   Smokeless tobacco: Never  Substance Use Topics   Alcohol use: No   Drug use: No    Home Medications Prior to Admission medications   Medication Sig Start Date End Date Taking? Authorizing Provider  amoxicillin-clavulanate (AUGMENTIN) 875-125 MG tablet Take 1 tablet by mouth 2 (two) times daily. One po bid x 7 days 05/12/21  Yes Wilferd Ritson, Dahlia Client, PA-C  fluticasone (FLONASE) 50 MCG/ACT nasal spray Place 2 sprays into both nostrils daily. 05/12/21  Yes Andraya Frigon, Dahlia Client, PA-C  Melatonin 1 MG TABS Take 1 tablet by mouth at bedtime as needed.    [provider]  oxyCODONE-acetaminophen (PERCOCET/ROXICET) 5-325 MG tablet Take 1 tablet by mouth every 4 (four) hours as needed for severe pain. 06/07/16   Barrett Henle, PA-C  traMADol (ULTRAM) 50 MG tablet Take 1 tablet (50 mg total) by mouth every 6 (six) hours as needed. 09/22/14   Santiago Glad, PA-C    Allergies    Measles virus vaccine  Review of Systems   Review of Systems  Constitutional:  Negative for appetite change, diaphoresis, fatigue, fever and unexpected weight change.  HENT:  Positive for congestion, ear pain, rhinorrhea, sinus pressure and sore throat. Negative for mouth sores.  Eyes:  Negative for visual disturbance.  Respiratory:  Positive for cough. Negative for chest tightness, shortness of breath and wheezing.   Cardiovascular:  Negative for chest pain.  Gastrointestinal:  Negative for abdominal pain, constipation, diarrhea, nausea and vomiting.  Endocrine: Negative for polydipsia, polyphagia and polyuria.  Genitourinary:  Negative for dysuria, frequency, hematuria and urgency.  Musculoskeletal:  Negative for back pain and neck stiffness.  Skin:  Negative for rash.  Allergic/Immunologic: Negative for immunocompromised state.  Neurological:  Positive for headaches. Negative for syncope and light-headedness.  Hematological:  Does not bruise/bleed easily.   Psychiatric/Behavioral:  Negative for sleep disturbance. The patient is not nervous/anxious.    Physical Exam Updated Vital Signs BP (!) 146/97 (BP Location: Right Arm)   Pulse 64   Temp 98.3 F (36.8 C) (Oral)   Resp 17   Ht 5\' 11"  (1.803 m)   Wt 85.7 kg   SpO2 99%   BMI 26.36 kg/m   Physical Exam Vitals and nursing note reviewed.  Constitutional:      General: He is not in acute distress.    Appearance: He is not diaphoretic.  HENT:     Head: Normocephalic.     Jaw: There is normal jaw occlusion.     Right Ear: A middle ear effusion is present. No mastoid tenderness. No hemotympanum. Tympanic membrane is erythematous and bulging.     Left Ear: A middle ear effusion is present. No mastoid tenderness. No hemotympanum. Tympanic membrane is erythematous and bulging.     Nose: Congestion present.     Mouth/Throat:     Lips: Pink.     Mouth: Mucous membranes are moist.     Pharynx: Oropharynx is clear.  Eyes:     General: No scleral icterus.    Conjunctiva/sclera: Conjunctivae normal.  Cardiovascular:     Rate and Rhythm: Normal rate and regular rhythm.     Pulses: Normal pulses.          Radial pulses are 2+ on the right side and 2+ on the left side.  Pulmonary:     Effort: Pulmonary effort is normal. No tachypnea, accessory muscle usage, prolonged expiration, respiratory distress or retractions.     Breath sounds: Normal breath sounds. No stridor.     Comments: Equal chest rise. No increased work of breathing. Abdominal:     General: There is no distension.     Palpations: Abdomen is soft.     Tenderness: There is no abdominal tenderness. There is no guarding or rebound.  Musculoskeletal:     Cervical back: Normal range of motion. No spinous process tenderness or muscular tenderness.     Comments: Moves all extremities equally and without difficulty.  Lymphadenopathy:     Cervical: No cervical adenopathy.  Skin:    General: Skin is warm and dry.     Capillary  Refill: Capillary refill takes less than 2 seconds.  Neurological:     Mental Status: He is alert.     GCS: GCS eye subscore is 4. GCS verbal subscore is 5. GCS motor subscore is 6.     Comments: Speech is clear and goal oriented.  Psychiatric:        Mood and Affect: Mood normal.    ED Results / Procedures / Treatments   Labs (all labs ordered are listed, but only abnormal results are displayed) Labs Reviewed  RESP PANEL BY RT-PCR (FLU A&B, COVID) [id number]      Procedures Procedures   Medications  Ordered in ED Medications  amoxicillin-clavulanate (AUGMENTIN) 875-125 MG per tablet 1 tablet (1 tablet Oral Given 05/12/21 0304)    ED Course  I have reviewed the triage vital signs and the nursing notes.  Pertinent labs & imaging results that were available during my care of the patient were reviewed by me and considered in my medical decision making (see chart for details).    MDM Rules/Calculators/A&P                           Patient with URI Arnold.  On exam bilateral otitis media with suppurative effusion and bulging.  No tympanic membrane rupture.  Lungs are clear and equal.  COVID test continues to pend however patient Arnold have been ongoing greater than 5 days.  No shortness of breath, tachycardia or hypoxia.  Will treat with Augmentin and Flonase.  Discussed close follow-up with primary care and reasons to return to the emergency department.  Patient states understanding and is in agreement with the plan.   Final Clinical Impression(s) / ED Diagnoses Final diagnoses:  Acute suppurative otitis media of both ears without spontaneous rupture of tympanic membranes, recurrence not specified  Viral URI with cough    Rx / DC Orders ED Discharge Orders          Ordered    amoxicillin-clavulanate (AUGMENTIN) 875-125 MG tablet  2 times daily        05/12/21 0259    fluticasone (FLONASE) 50 MCG/ACT nasal spray  Daily        05/12/21 0259              Alwilda Gilland, Dahlia Client, PA-C 05/12/21 0305    Glynn Octave, MD 05/12/21 [phone]

## 2021-05-12 NOTE — Discharge Instructions (Addendum)
1. Medications: augmentin, flonase, usual home medications 2. Treatment: rest, drink plenty of fluids,  3. Follow Up: Please followup with your primary doctor in 2-3 days for discussion of your diagnoses and further evaluation after today's visit; if you do not have a primary care doctor use the resource guide provided to find one; Please return to the ER for new or worsening symptoms

## 2021-12-21 ENCOUNTER — Encounter (HOSPITAL_COMMUNITY): Payer: Self-pay | Admitting: Emergency Medicine

## 2021-12-21 ENCOUNTER — Emergency Department (HOSPITAL_COMMUNITY): Payer: Self-pay

## 2021-12-21 ENCOUNTER — Emergency Department (HOSPITAL_COMMUNITY)
Admission: EM | Admit: 2021-12-21 | Discharge: 2021-12-21 | Disposition: A | Discharge status: Home / Self Care | Payer: Self-pay | Attending: Emergency Medicine | Admitting: Emergency Medicine

## 2021-12-21 DIAGNOSIS — Z20822 Contact with and (suspected) exposure to covid-19: Secondary | ICD-10-CM | POA: Insufficient documentation

## 2021-12-21 DIAGNOSIS — J069 Acute upper respiratory infection, unspecified: Secondary | ICD-10-CM | POA: Insufficient documentation

## 2021-12-21 DIAGNOSIS — Z8616 Personal history of COVID-19: Secondary | ICD-10-CM | POA: Insufficient documentation

## 2021-12-21 LAB — COMPREHENSIVE METABOLIC PANEL
ALT: 69 U/L — ABNORMAL HIGH (ref 0–44)
AST: 96 U/L — ABNORMAL HIGH (ref 15–41)
Albumin: 4.9 g/dL (ref 3.5–5.0)
Alkaline Phosphatase: 89 U/L (ref 38–126)
Anion gap: 13 (ref 5–15)
BUN: 13 mg/dL (ref 6–20)
CO2: 25 mmol/L (ref 22–32)
Calcium: 9.1 mg/dL (ref 8.9–10.3)
Chloride: 103 mmol/L (ref 98–111)
Creatinine, Ser: 0.69 mg/dL (ref 0.61–1.24)
GFR, Estimated: >60 mL/min (ref >60)
Glucose, Bld: 94 mg/dL (ref 70–99)
Potassium: 3.7 mmol/L (ref 3.5–5.1)
Sodium: 141 mmol/L (ref 135–145)
Total Bilirubin: 1 mg/dL (ref 0.3–1.2)
Total Protein: 7.8 g/dL (ref 6.5–8.1)

## 2021-12-21 LAB — URINALYSIS, ROUTINE W REFLEX MICROSCOPIC
Bacteria, UA: NONE SEEN
Bilirubin Urine: NEGATIVE
Glucose, UA: NEGATIVE mg/dL
Ketones, ur: 20 mg/dL — AB
Leukocytes,Ua: NEGATIVE
Nitrite: NEGATIVE
Protein, ur: 100 mg/dL — AB
Specific Gravity, Urine: 1.024 (ref 1.005–1.030)
pH: 6 (ref 5.0–8.0)

## 2021-12-21 LAB — CBC
HCT: 47.4 % (ref 39.0–52.0)
Hemoglobin: 17.1 g/dL — ABNORMAL HIGH (ref 13.0–17.0)
MCH: 36.2 pg — ABNORMAL HIGH (ref 26.0–34.0)
MCHC: 36.1 g/dL — ABNORMAL HIGH (ref 30.0–36.0)
MCV: 100.4 fL — ABNORMAL HIGH (ref 80.0–100.0)
Platelets: 246 10*3/uL (ref 150–400)
RBC: 4.72 MIL/uL (ref 4.22–5.81)
RDW: 13.2 % (ref 11.5–15.5)
WBC: 10 10*3/uL (ref 4.0–10.5)
nRBC: 0 % (ref 0.0–0.2)

## 2021-12-21 LAB — LIPASE, BLOOD: Lipase: 33 U/L (ref 11–51)

## 2021-12-21 LAB — RESP PANEL BY RT-PCR (FLU A&B, COVID) ARPGX2
Influenza A by PCR: NEGATIVE
Influenza B by PCR: NEGATIVE
SARS Coronavirus 2 by RT PCR: NEGATIVE

## 2021-12-21 MED ORDER — SODIUM CHLORIDE 0.9 % IV BOLUS
1000.0000 mL | Freq: Once | INTRAVENOUS | Status: AC
  Administered 2021-12-21: 1000 mL via INTRAVENOUS

## 2021-12-21 NOTE — ED Notes (Signed)
MD at bedside. 

## 2021-12-21 NOTE — Discharge Instructions (Signed)
Return for any problem.  ? ?Drink plenty of fluids. ?

## 2021-12-21 NOTE — ED Provider Notes (Signed)
?[facility]-EMERGENCY DEPT ?Provider Note ? ? ?CSN: [id number] ?Arrival date & time: 12/21/21  1441 ? ?  ? ?History ? ?Chief Complaint  ?Patient presents with  ? Cough  ? Vomiting  ? ? ?Ian Arnold is a 56 y.o. male. ? ?56 year old male with prior medical history as detailed below presents for evaluation.  Patient with 4 to 5 days of congestion, cough, fatigue, myalgias. ? ?Reports decreased p.o. intake at home. ? ?Patient's symptoms described are consistent with a prior episode of COVID infection 6 months ago per his report. ? ?No abdominal pain or diarrhea.  No chest pain or shortness of breath. ? ?The history is provided by the patient and medical records.  ?Cough ?Cough characteristics:  Dry ?Sputum characteristics:  Nondescript ?Severity:  Mild ?Onset quality:  Gradual ?Duration:  4 days ?Timing:  Constant ?Progression:  Waxing and waning ?Chronicity:  New ? ?  ? ?Home Medications ?Prior to Admission medications   ?Medication Sig Start Date End Date Taking? Authorizing Provider  ?amoxicillin-clavulanate (AUGMENTIN) 875-125 MG tablet Take 1 tablet by mouth 2 (two) times daily. One po bid x 7 days 05/12/21   Muthersbaugh, Dahlia ClientHannah, PA-C  ?fluticasone (FLONASE) 50 MCG/ACT nasal spray Place 2 sprays into both nostrils daily. 05/12/21   Muthersbaugh, Dahlia ClientHannah, PA-C  ?Melatonin 1 MG TABS Take 1 tablet by mouth at bedtime as needed.    [provider]  ?oxyCODONE-acetaminophen (PERCOCET/ROXICET) 5-325 MG tablet Take 1 tablet by mouth every 4 (four) hours as needed for severe pain. 06/07/16   Barrett HenleNadeau, Nicole Elizabeth, PA-C  ?traMADol (ULTRAM) 50 MG tablet Take 1 tablet (50 mg total) by mouth every 6 (six) hours as needed. 09/22/14   Santiago GladLaisure, Heather, PA-C  ?   ? ?Allergies    ?Measles virus live vaccine   ? ?Review of Systems   ?Review of Systems  ?Respiratory:  Positive for cough.   ?All other systems reviewed and are negative. ? ?Physical Exam ?Updated Vital Signs ?BP (!) 152/92   Pulse 82    Temp 98.1 ?F (36.7 ?C) (Oral)   Resp 20   SpO2 98%  ?Physical Exam ?Vitals and nursing note reviewed.  ?Constitutional:   ?   General: He is not in acute distress. ?   Appearance: Normal appearance. He is well-developed.  ?HENT:  ?   Head: Normocephalic and atraumatic.  ?Eyes:  ?   Conjunctiva/sclera: Conjunctivae normal.  ?   Pupils: Pupils are equal, round, and reactive to light.  ?Cardiovascular:  ?   Rate and Rhythm: Normal rate and regular rhythm.  ?   Heart sounds: Normal heart sounds.  ?Pulmonary:  ?   Effort: Pulmonary effort is normal. No respiratory distress.  ?   Breath sounds: Normal breath sounds.  ?Abdominal:  ?   General: There is no distension.  ?   Palpations: Abdomen is soft.  ?   Tenderness: There is no abdominal tenderness.  ?Musculoskeletal:     ?   General: No deformity. Normal range of motion.  ?   Cervical back: Normal range of motion and neck supple.  ?Skin: ?   General: Skin is warm and dry.  ?Neurological:  ?   General: No focal deficit present.  ?   Mental Status: He is alert and oriented to person, place, and time.  ? ? ?ED Results / Procedures / Treatments   ?Labs ?(all labs ordered are listed, but only abnormal results are displayed) ?Labs Reviewed  ?COMPREHENSIVE METABOLIC PANEL -  Abnormal; Notable for the following components:  ?    Result Value  ? AST 96 (*)   ? ALT 69 (*)   ? All other components within normal limits  ?CBC - Abnormal; Notable for the following components:  ? Hemoglobin 17.1 (*)   ? MCV 100.4 (*)   ? MCH 36.2 (*)   ? MCHC 36.1 (*)   ? All other components within normal limits  ?URINALYSIS, ROUTINE W REFLEX MICROSCOPIC - Abnormal; Notable for the following components:  ? Color, Urine AMBER (*)   ? Hgb urine dipstick SMALL (*)   ? Ketones, ur 20 (*)   ? Protein, ur 100 (*)   ? All other components within normal limits  ?RESP PANEL BY RT-PCR (FLU A&B, COVID) [id number]  ?LIPASE, BLOOD  ? ? ?EKG ?None ? ?Radiology ?DG Chest Port 1 View ? ?Result Date: 12/21/2021 ?CLINICAL  DATA:  Cough, congestion and fever, with decreased p.o. intake. EXAM: PORTABLE CHEST 1 VIEW COMPARISON:  None. FINDINGS: The cardiac size is normal. There is aortic tortuosity with calcification of the transverse segment, otherwise unremarkable mediastinal configuration. No vascular congestion is seen. No significant pleural effusion is evident. The lungs hyperinflated but clear. Thoracic spondylosis. IMPRESSION: 1. Hyperinflated chest with no acute cardiopulmonary findings. 2. Aortic atherosclerosis and uncoiling. Correlate clinically for untreated hypertension. Electronically Signed   By: Almira Bar M.D.   On: 12/21/2021 20:05   ? ?Procedures ?Procedures  ? ? ?Medications Ordered in ED ?Medications  ?sodium chloride 0.9 % bolus 1,000 mL (0 mLs Intravenous Stopped 12/21/21 2137)  ? ? ?ED Course/ Medical Decision Making/ A&P ?  ?                        ?Medical Decision Making ?Amount and/or Complexity of Data Reviewed ?Labs: ordered. ?Radiology: ordered. ? ? ? ?Medical Screen Complete ? ?This patient presented to the ED with complaint of chest pain, cough, fatigue, malaise. ? ?This complaint involves an extensive number of treatment options. The initial differential diagnosis includes, but is not limited to, likely viral syndrome, metabolic abnormality, dehydration, etc. ? ?This presentation is: Acute, Self-Limited, Previously Undiagnosed, Uncertain Prognosis, Complicated, Systemic Symptoms, and Threat to Life/Bodily Function ?Patient is presenting with complaints of cough, congestion, fatigue, myalgia. ? ?Symptoms are most consistent with likely viral pathology. ? ?Patient work-up is without evidence of significant acute pathology.  Patient does appear to be mildly dehydrated on exam. ? ?With administration of IV fluids he feels significantly improved.  After his ED evaluation he desires discharge home.  He does understand need for close outpatient follow-up and strict return precautions. ? ?Additional history  obtained: ? ?External records from outside sources obtained and reviewed including prior ED visits and prior Inpatient records.  ? ? ?Lab Tests: ? ?I ordered and personally interpreted labs.  The pertinent results include: CBC, CMP, UA, COVID, flu ? ? ?Imaging Studies ordered: ? ?I ordered imaging studies including chest x-ray ?I independently visualized and interpreted obtained imaging which showed NAD ?I agree with the radiologist interpretation. ? ? ?Cardiac Monitoring: ? ?The patient was maintained on a cardiac monitor.  I personally viewed and interpreted the cardiac monitor which showed an underlying rhythm of: Sinus tach ? ? ?Medicines ordered: ? ?I ordered medication including IV fluids for dehydration ?Reevaluation of the patient after these medicines showed that the patient: improved ? ?Problem List / ED Course: ? ?Viral infection, dehydration ? ? ?Reevaluation: ? ?After the interventions  noted above, I reevaluated the patient and found that they have: improved ? ?Disposition: ? ?After consideration of the diagnostic results and the patients response to treatment, I feel that the patent would benefit from close outpatient follow-up.  ? ? ? ? ? ? ? ? ?Final Clinical Impression(s) / ED Diagnoses ?Final diagnoses:  ?Upper respiratory tract infection, unspecified type  ? ? ?Rx / DC Orders ?ED Discharge Orders   ? ? None  ? ?  ? ? ?  ?Wynetta Fines, MD ?12/22/21 0003 ? ?

## 2021-12-21 NOTE — ED Triage Notes (Addendum)
Patient c/o cough, congestion, fever, N/V x4 days. Reports decreased PO intake. ?

## 2021-12-21 NOTE — ED Provider Triage Note (Signed)
Emergency Medicine Provider Triage Evaluation Note ? ?Lannie Fields , a 56 y.o. male  was evaluated in triage.  Pt complains of cough, congestion, fatigue, generalized weakness, myalgias, nausea, and vomiting.  This all started 3 to 4 days ago.  He states this feels similar to when he had COVID last fall.  No diarrhea. ? ?Review of Systems  ?Positive: See above  ?Negative:  ? ?Physical Exam  ?BP (!) 136/95 (BP Location: Left Arm)   Pulse (!) 110   Temp 98.2 ?F (36.8 ?C) (Oral)   Resp 17   SpO2 96%  ?Gen:   Awake, no distress   ?Resp:  Normal effort  ?MSK:   Moves extremities without difficulty  ?Other:   ? ?Medical Decision Making  ?Medically screening exam initiated at 3:23 PM.  Appropriate orders placed.  Elwyn Lade Huster was informed that the remainder of the evaluation will be completed by another provider, this initial triage assessment does not replace that evaluation, and the importance of remaining in the ED until their evaluation is complete. ? ? ?  ?Hendricks Limes, PA-C ?12/21/21 1524 ? ?

## 2022-04-16 ENCOUNTER — Other Ambulatory Visit: Payer: Self-pay

## 2022-04-16 ENCOUNTER — Encounter (HOSPITAL_BASED_OUTPATIENT_CLINIC_OR_DEPARTMENT_OTHER): Payer: Self-pay | Admitting: Emergency Medicine

## 2022-04-16 ENCOUNTER — Emergency Department (HOSPITAL_BASED_OUTPATIENT_CLINIC_OR_DEPARTMENT_OTHER)
Admission: EM | Admit: 2022-04-16 | Discharge: 2022-04-16 | Disposition: A | Discharge status: Home / Self Care | Payer: Self-pay | Attending: Emergency Medicine | Admitting: Emergency Medicine

## 2022-04-16 DIAGNOSIS — W171XXA Fall into storm drain or manhole, initial encounter: Secondary | ICD-10-CM | POA: Insufficient documentation

## 2022-04-16 DIAGNOSIS — M25521 Pain in right elbow: Secondary | ICD-10-CM

## 2022-04-16 DIAGNOSIS — M25562 Pain in left knee: Secondary | ICD-10-CM

## 2022-04-16 DIAGNOSIS — M79604 Pain in right leg: Secondary | ICD-10-CM

## 2022-04-16 NOTE — ED Notes (Signed)
Pt. Reports he fell in a man hole causing injury to the L knee and now he has pain in his back also.  Pt. Is up walking around in the room with no problems and asking RN how much longer this is going to take.

## 2022-04-16 NOTE — ED Provider Notes (Signed)
[facility] EMERGENCY DEPARTMENT Provider Note   CSN: [id number] Arrival date & time: 04/16/22  1606     History  Chief Complaint  Patient presents with   Leg Pain    Ian Arnold is a 56 y.o. male.  56 yo M with a chief complaints of falling in an open manhole cover.  This happened yesterday.  The patient states that he fell with his right leg in and landed with his left knee on top.  He said his pain mostly is in the left knee.  Mostly along the medial aspect.  Has been able to ambulate but has some pain there.  Starts out a bit stiff and then improves with ambulation.  He also bumped his right elbow and has some pain to his low back.  He denies head injury denies loss of consciousness.   Leg Pain      Home Medications Prior to Admission medications   Medication Sig Start Date End Date Taking? Authorizing Provider  amoxicillin-clavulanate (AUGMENTIN) 875-125 MG tablet Take 1 tablet by mouth 2 (two) times daily. One po bid x 7 days 05/12/21   Muthersbaugh, Dahlia Client, PA-C  fluticasone [facility]) 50 MCG/ACT nasal spray Place 2 sprays into both nostrils daily. 05/12/21   Muthersbaugh, Dahlia Client, PA-C  Melatonin 1 MG TABS Take 1 tablet by mouth at bedtime as needed.    [provider]  oxyCODONE-acetaminophen (PERCOCET/ROXICET) 5-325 MG tablet Take 1 tablet by mouth every 4 (four) hours as needed for severe pain. 06/07/16   Barrett Henle, PA-C  traMADol (ULTRAM) 50 MG tablet Take 1 tablet (50 mg total) by mouth every 6 (six) hours as needed. 09/22/14   Santiago Glad, PA-C      Allergies    Measles virus live vaccine    Review of Systems   Review of Systems  Physical Exam Updated Vital Signs BP (!) 147/108 (BP Location: Left Arm)   Pulse 87   Temp 98.6 F (37 C) (Oral)   Resp 18   SpO2 97%  Physical Exam Vitals and nursing note reviewed.  Constitutional:      Appearance: He is well-developed.  HENT:     Head: Normocephalic and atraumatic.   Eyes:     Pupils: Pupils are equal, round, and reactive to light.  Neck:     Vascular: No JVD.  Cardiovascular:     Rate and Rhythm: Normal rate and regular rhythm.     Heart sounds: No murmur heard.    No friction rub. No gallop.  Pulmonary:     Effort: No respiratory distress.     Breath sounds: No wheezing.  Abdominal:     General: There is no distension.     Tenderness: There is no abdominal tenderness. There is no guarding or rebound.  Musculoskeletal:        General: Swelling present. Normal range of motion.     Cervical back: Normal range of motion and neck supple.     Comments: Moderate swelling about the medial aspect of the left knee.  No obvious ligamentous laxity negative McMurray's.  Pulse motor and sensation intact distally.  No obvious edema to the right knee.  Full range of motion without pain.  No obvious ligamentous laxity.  Right olecranon with some mild swelling.  Able to range without significant pain.  No pain with supination or pronation.  Skin:    Coloration: Skin is not pale.     Findings: No rash.  Neurological:  Mental Status: He is alert and oriented to person, place, and time.  Psychiatric:        Behavior: Behavior normal.     ED Results / Procedures / Treatments   Labs (all labs ordered are listed, but only abnormal results are displayed) Labs Reviewed - No data to display  EKG None  Radiology No results found.  Procedures Procedures    Medications Ordered in ED Medications - No data to display  ED Course/ Medical Decision Making/ A&P                           Medical Decision Making  56 yo M with a chief complaints of a fall.  This was through an open manhole cover.  He is complaining mostly of left knee pain.  I offered imaging of the left knee which she is declining.  Will place in a knee immobilizer.  We will give follow-up for sports medicine.  5:21 PM:  I have discussed the diagnosis/risks/treatment options with the  patient.  Evaluation and diagnostic testing in the emergency department does not suggest an emergent condition requiring admission or immediate intervention beyond what has been performed at this time.  They will follow up with  PCP, sports med. We also discussed returning to the ED immediately if new or worsening sx occur. We discussed the sx which are most concerning (e.g., sudden worsening pain, fever, inability to tolerate by mouth) that necessitate immediate return. Medications administered to the patient during their visit and any new prescriptions provided to the patient are listed below.  Medications given during this visit Medications - No data to display   The patient appears reasonably screen and/or stabilized for discharge and I doubt any other medical condition or other [facility] requiring further screening, evaluation, or treatment in the ED at this time prior to discharge.          Final Clinical Impression(s) / ED Diagnoses Final diagnoses:  Right leg pain  Acute pain of left knee  Right elbow pain    Rx / DC Orders ED Discharge Orders     None         Melene Plan, DO 04/16/22 1721

## 2022-04-16 NOTE — ED Triage Notes (Signed)
Patient presents to ED via POV from work. Reports he steps through a "man hole". Reports bilateral leg pain. Abrasion noted to right elbow. GCS 15. Ambulatory. Denies LOC.

## 2022-04-16 NOTE — Discharge Instructions (Signed)
Take 4 over the counter ibuprofen tablets 3 times a day or 2 over-the-counter naproxen tablets twice a day for pain. Also take tylenol 1000mg(2 extra strength) four times a day.    

## 2022-04-20 ENCOUNTER — Ambulatory Visit: Payer: Self-pay | Admitting: Family Medicine

## 2022-12-14 ENCOUNTER — Encounter: Payer: Self-pay | Admitting: *Deleted

## 2023-03-23 ENCOUNTER — Other Ambulatory Visit: Payer: Self-pay

## 2023-03-23 ENCOUNTER — Encounter (HOSPITAL_COMMUNITY): Payer: Self-pay | Admitting: Family Medicine

## 2023-03-23 ENCOUNTER — Inpatient Hospital Stay (HOSPITAL_COMMUNITY)
Admission: EM | Admit: 2023-03-23 | Discharge: 2023-03-26 | DRG: 378 | Disposition: A | Discharge status: Home / Self Care | Payer: Medicaid Other | Attending: Internal Medicine | Admitting: Internal Medicine

## 2023-03-23 DIAGNOSIS — K0889 Other specified disorders of teeth and supporting structures: Secondary | ICD-10-CM | POA: Diagnosis present

## 2023-03-23 DIAGNOSIS — K922 Gastrointestinal hemorrhage, unspecified: Secondary | ICD-10-CM | POA: Diagnosis present

## 2023-03-23 DIAGNOSIS — Z5941 Food insecurity: Secondary | ICD-10-CM

## 2023-03-23 DIAGNOSIS — Z5982 Transportation insecurity: Secondary | ICD-10-CM

## 2023-03-23 DIAGNOSIS — E039 Hypothyroidism, unspecified: Secondary | ICD-10-CM | POA: Diagnosis present

## 2023-03-23 DIAGNOSIS — K2971 Gastritis, unspecified, with bleeding: Secondary | ICD-10-CM | POA: Diagnosis present

## 2023-03-23 DIAGNOSIS — K269 Duodenal ulcer, unspecified as acute or chronic, without hemorrhage or perforation: Secondary | ICD-10-CM | POA: Diagnosis not present

## 2023-03-23 DIAGNOSIS — Z87891 Personal history of nicotine dependence: Secondary | ICD-10-CM | POA: Diagnosis not present

## 2023-03-23 DIAGNOSIS — E440 Moderate protein-calorie malnutrition: Secondary | ICD-10-CM | POA: Diagnosis present

## 2023-03-23 DIAGNOSIS — Z79890 Hormone replacement therapy (postmenopausal): Secondary | ICD-10-CM

## 2023-03-23 DIAGNOSIS — D62 Acute posthemorrhagic anemia: Secondary | ICD-10-CM | POA: Diagnosis present

## 2023-03-23 DIAGNOSIS — F1010 Alcohol abuse, uncomplicated: Secondary | ICD-10-CM | POA: Diagnosis present

## 2023-03-23 DIAGNOSIS — Y92009 Unspecified place in unspecified non-institutional (private) residence as the place of occurrence of the external cause: Secondary | ICD-10-CM | POA: Diagnosis not present

## 2023-03-23 DIAGNOSIS — K264 Chronic or unspecified duodenal ulcer with hemorrhage: Secondary | ICD-10-CM | POA: Diagnosis not present

## 2023-03-23 DIAGNOSIS — K5900 Constipation, unspecified: Secondary | ICD-10-CM | POA: Diagnosis present

## 2023-03-23 DIAGNOSIS — K219 Gastro-esophageal reflux disease without esophagitis: Secondary | ICD-10-CM | POA: Diagnosis present

## 2023-03-23 DIAGNOSIS — K2970 Gastritis, unspecified, without bleeding: Secondary | ICD-10-CM | POA: Diagnosis not present

## 2023-03-23 DIAGNOSIS — T39395A Adverse effect of other nonsteroidal anti-inflammatory drugs [NSAID], initial encounter: Secondary | ICD-10-CM | POA: Diagnosis present

## 2023-03-23 DIAGNOSIS — R748 Abnormal levels of other serum enzymes: Secondary | ICD-10-CM | POA: Diagnosis present

## 2023-03-23 DIAGNOSIS — K3189 Other diseases of stomach and duodenum: Secondary | ICD-10-CM | POA: Diagnosis present

## 2023-03-23 DIAGNOSIS — Z79899 Other long term (current) drug therapy: Secondary | ICD-10-CM

## 2023-03-23 DIAGNOSIS — E871 Hypo-osmolality and hyponatremia: Secondary | ICD-10-CM | POA: Diagnosis not present

## 2023-03-23 DIAGNOSIS — K7010 Alcoholic hepatitis without ascites: Secondary | ICD-10-CM | POA: Diagnosis present

## 2023-03-23 DIAGNOSIS — Z6825 Body mass index (BMI) 25.0-25.9, adult: Secondary | ICD-10-CM | POA: Diagnosis not present

## 2023-03-23 LAB — CBC
HCT: 19.1 % — ABNORMAL LOW (ref 39.0–52.0)
Hemoglobin: 6.5 g/dL — CL (ref 13.0–17.0)
MCH: 35.1 pg — ABNORMAL HIGH (ref 26.0–34.0)
MCHC: 34 g/dL (ref 30.0–36.0)
MCV: 103.2 fL — ABNORMAL HIGH (ref 80.0–100.0)
Platelets: 227 10*3/uL (ref 150–400)
RBC: 1.85 MIL/uL — ABNORMAL LOW (ref 4.22–5.81)
RDW: 15 % (ref 11.5–15.5)
WBC: 9.8 10*3/uL (ref 4.0–10.5)
nRBC: 0 % (ref 0.0–0.2)

## 2023-03-23 LAB — ABO/RH: ABO/RH(D): O POS

## 2023-03-23 LAB — TYPE AND SCREEN
ABO/RH(D): O POS
Antibody Screen: NEGATIVE

## 2023-03-23 LAB — COMPREHENSIVE METABOLIC PANEL
ALT: 53 U/L — ABNORMAL HIGH (ref 0–44)
AST: 53 U/L — ABNORMAL HIGH (ref 15–41)
Albumin: 3.6 g/dL (ref 3.5–5.0)
Alkaline Phosphatase: 48 U/L (ref 38–126)
Anion gap: 7 (ref 5–15)
BUN: 10 mg/dL (ref 6–20)
CO2: 25 mmol/L (ref 22–32)
Calcium: 8.5 mg/dL — ABNORMAL LOW (ref 8.9–10.3)
Chloride: 100 mmol/L (ref 98–111)
Creatinine, Ser: 0.79 mg/dL (ref 0.61–1.24)
GFR, Estimated: >60 mL/min (ref >60)
Glucose, Bld: 99 mg/dL (ref 70–99)
Potassium: 4 mmol/L (ref 3.5–5.1)
Sodium: 132 mmol/L — ABNORMAL LOW (ref 135–145)
Total Bilirubin: 0.5 mg/dL (ref 0.3–1.2)
Total Protein: 5.9 g/dL — ABNORMAL LOW (ref 6.5–8.1)

## 2023-03-23 LAB — PREPARE RBC (CROSSMATCH)

## 2023-03-23 LAB — BPAM RBC

## 2023-03-23 LAB — POC OCCULT BLOOD, ED: Fecal Occult Bld: POSITIVE — AB

## 2023-03-23 MED ORDER — FOLIC ACID 1 MG PO TABS
1.0000 mg | ORAL_TABLET | Freq: Every day | ORAL | Status: DC
  Administered 2023-03-23 – 2023-03-26 (×4): 1 mg via ORAL
  Filled 2023-03-23 (×4): qty 1

## 2023-03-23 MED ORDER — LACTATED RINGERS IV BOLUS
1000.0000 mL | Freq: Once | INTRAVENOUS | Status: AC
  Administered 2023-03-23: 1000 mL via INTRAVENOUS

## 2023-03-23 MED ORDER — LORAZEPAM 1 MG PO TABS: 0.0000 mg | ORAL_TABLET | Freq: Two times a day (BID) | ORAL | Status: DC

## 2023-03-23 MED ORDER — ONDANSETRON HCL 4 MG/2ML IJ SOLN: 4.0000 mg | Freq: Four times a day (QID) | INTRAMUSCULAR | Status: DC | PRN

## 2023-03-23 MED ORDER — MELATONIN 3 MG PO TABS
3.0000 mg | ORAL_TABLET | Freq: Every evening | ORAL | Status: DC | PRN
  Administered 2023-03-23 – 2023-03-25 (×2): 3 mg via ORAL
  Filled 2023-03-23 (×2): qty 1

## 2023-03-23 MED ORDER — NICOTINE 21 MG/24HR TD PT24
21.0000 mg | MEDICATED_PATCH | Freq: Every day | TRANSDERMAL | Status: DC
  Administered 2023-03-23 – 2023-03-26 (×4): 21 mg via TRANSDERMAL
  Filled 2023-03-23 (×4): qty 1

## 2023-03-23 MED ORDER — THIAMINE HCL 100 MG/ML IJ SOLN
100.0000 mg | Freq: Every day | INTRAMUSCULAR | Status: DC
  Filled 2023-03-23: qty 2

## 2023-03-23 MED ORDER — THIAMINE MONONITRATE 100 MG PO TABS
100.0000 mg | ORAL_TABLET | Freq: Every day | ORAL | Status: DC
  Administered 2023-03-23 – 2023-03-26 (×4): 100 mg via ORAL
  Filled 2023-03-23 (×4): qty 1

## 2023-03-23 MED ORDER — ONDANSETRON HCL 4 MG PO TABS: 4.0000 mg | ORAL_TABLET | Freq: Four times a day (QID) | ORAL | Status: DC | PRN

## 2023-03-23 MED ORDER — ADULT MULTIVITAMIN W/MINERALS CH
1.0000 | ORAL_TABLET | Freq: Every day | ORAL | Status: DC
  Administered 2023-03-23 – 2023-03-26 (×4): 1 via ORAL
  Filled 2023-03-23 (×4): qty 1

## 2023-03-23 MED ORDER — PANTOPRAZOLE 80MG IVPB - SIMPLE MED
80.0000 mg | Freq: Once | INTRAVENOUS | Status: AC
  Administered 2023-03-23: 80 mg via INTRAVENOUS
  Filled 2023-03-23: qty 80

## 2023-03-23 MED ORDER — SODIUM CHLORIDE 0.9% FLUSH
3.0000 mL | Freq: Two times a day (BID) | INTRAVENOUS | Status: DC
  Administered 2023-03-23 – 2023-03-26 (×6): 3 mL via INTRAVENOUS

## 2023-03-23 MED ORDER — LORAZEPAM 1 MG PO TABS
1.0000 mg | ORAL_TABLET | ORAL | Status: DC | PRN
  Administered 2023-03-25: 2 mg via ORAL
  Administered 2023-03-25: 4 mg via ORAL
  Filled 2023-03-23: qty 4
  Filled 2023-03-23: qty 2
  Filled 2023-03-23: qty 1

## 2023-03-23 MED ORDER — LORAZEPAM 1 MG PO TABS
0.0000 mg | ORAL_TABLET | Freq: Four times a day (QID) | ORAL | Status: AC
  Administered 2023-03-23 – 2023-03-24 (×3): 1 mg via ORAL
  Administered 2023-03-25: 2 mg via ORAL
  Filled 2023-03-23 (×4): qty 1

## 2023-03-23 MED ORDER — ACETAMINOPHEN 325 MG PO TABS
650.0000 mg | ORAL_TABLET | Freq: Four times a day (QID) | ORAL | Status: DC | PRN
  Administered 2023-03-23: 650 mg via ORAL
  Filled 2023-03-23: qty 2

## 2023-03-23 MED ORDER — ACETAMINOPHEN 650 MG RE SUPP: 650.0000 mg | Freq: Four times a day (QID) | RECTAL | Status: DC | PRN

## 2023-03-23 MED ORDER — PANTOPRAZOLE INFUSION (NEW) - SIMPLE MED
8.0000 mg/h | INTRAVENOUS | Status: DC
  Administered 2023-03-23 – 2023-03-24 (×2): 8 mg/h via INTRAVENOUS
  Filled 2023-03-23 (×2): qty 80
  Filled 2023-03-23 (×2): qty 100

## 2023-03-23 MED ORDER — SODIUM CHLORIDE 0.9% IV SOLUTION: Freq: Once | INTRAVENOUS | Status: AC

## 2023-03-23 MED ORDER — LACTATED RINGERS IV SOLN: INTRAVENOUS | Status: AC

## 2023-03-23 NOTE — Plan of Care (Signed)

## 2023-03-23 NOTE — ED Notes (Signed)
ED TO INPATIENT HANDOFF REPORT  Name/Age/Gender Ian Arnold 57 y.o. male  Code Status    Code Status Orders  (From admission, onward)           Start     Ordered   03/23/23 1510  Full code  Continuous       Question:  By:  Answer:  Consent: discussion documented in EHR   03/23/23 1511           Code Status History     This patient has a current code status but no historical code status.       Home/SNF/Other Home  Chief Complaint Upper GI bleed [K92.2]  Level of Care/Admitting Diagnosis ED Disposition     ED Disposition  Admit   Condition  --   Comment  Hospital Area: [facility] [[id number]]  Level of Care: Telemetry [5]  Admit to tele based on following criteria: Monitor QTC interval  May admit patient to [facility] or [facility] if equivalent level of care is available:: Yes  Covid Evaluation: Asymptomatic - no recent exposure (last 10 days) testing not required  Diagnosis: Upper GI bleed [478295]  Admitting Physician: Briscoe Deutscher [[id number]]  Attending Physician: Briscoe Deutscher [[id number]]  Certification:: I certify this patient will need inpatient services for at least 2 midnights  Estimated Length of Stay: 3          Medical History Past Medical History:  Diagnosis Date   GERD (gastroesophageal reflux disease)    tums   Hypothyroidism    has never been on medication   Thyroid disease     Allergies Allergies  Allergen Reactions   Measles Virus Live Vaccine Other (See Comments)    As a child    IV Location/Drains/Wounds Patient Lines/Drains/Airways Status     Active Line/Drains/Airways     Name Placement date Placement time Site Days   Peripheral IV 03/23/23 18 G Left Antecubital 03/23/23  1303  Antecubital  less than 1   Incision 07/18/12 Groin Right 07/18/12  1108  -- 3900            Labs/Imaging Results for orders placed or performed during the hospital encounter of 03/23/23 (from the past 48  hour(s))  POC occult blood, ED     Status: Abnormal   Collection Time: 03/23/23 12:41 PM  Result Value Ref Range   Fecal Occult Bld POSITIVE (A) NEGATIVE  CBC     Status: Abnormal   Collection Time: 03/23/23  1:00 PM  Result Value Ref Range   WBC 9.8 4.0 - 10.5 K/uL   RBC 1.85 (L) 4.22 - 5.81 MIL/uL   Hemoglobin 6.5 (LL) 13.0 - 17.0 g/dL    Comment: REPEATED TO VERIFY THIS CRITICAL RESULT HAS VERIFIED AND BEEN CALLED TO MILES,T. RN BY NICOLE MCCOY ON 07 23 2024 AT 1431, AND HAS BEEN READ BACK.     HCT 19.1 (L) 39.0 - 52.0 %   MCV 103.2 (H) 80.0 - 100.0 fL   MCH 35.1 (H) 26.0 - 34.0 pg   MCHC 34.0 30.0 - 36.0 g/dL   RDW 29.5 28.4 - 13.2 %   Platelets 227 150 - 400 K/uL   nRBC 0.0 0.0 - 0.2 %    Comment: Performed at [facility], [address], [place], [place] [address]  Comprehensive metabolic panel     Status: Abnormal   Collection Time: 03/23/23  1:00 PM  Result Value Ref Range  Sodium 132 (L) 135 - 145 mmol/L   Potassium 4.0 3.5 - 5.1 mmol/L   Chloride 100 98 - 111 mmol/L   CO2 25 22 - 32 mmol/L   Glucose, Bld 99 70 - 99 mg/dL    Comment: Glucose reference range applies only to samples taken after fasting for at least 8 hours.   BUN 10 6 - 20 mg/dL   Creatinine, Ser 1.61 0.61 - 1.24 mg/dL   Calcium 8.5 (L) 8.9 - 10.3 mg/dL   Total Protein 5.9 (L) 6.5 - 8.1 g/dL   Albumin 3.6 3.5 - 5.0 g/dL   AST 53 (H) 15 - 41 U/L   ALT 53 (H) 0 - 44 U/L   Alkaline Phosphatase 48 38 - 126 U/L   Total Bilirubin 0.5 0.3 - 1.2 mg/dL   GFR, Estimated >09 >60 mL/min    Comment: (NOTE) Calculated using the CKD-EPI Creatinine Equation (2021)    Anion gap 7 5 - 15    Comment: Performed at [facility], [address] [place], [place] [address]  Type and screen [facility]     Status: None   Collection Time: 03/23/23  1:00 PM  Result Value Ref Range   ABO/RH(D) O POS    Antibody Screen NEG    Sample Expiration       03/26/2023,2359 Performed at [facility], [address], [place], [place] [address]    No results found.  Pending Labs Unresulted Labs (From admission, onward)     Start     Ordered   03/24/23 0500  HIV Antibody (routine testing w rflx)  (HIV Antibody (Routine testing w reflex) panel)  Tomorrow morning,   R        03/23/23 1511   03/24/23 0500  Comprehensive metabolic panel  Daily,   R      03/23/23 1511   03/24/23 0500  Magnesium  Tomorrow morning,   R        03/23/23 1511   03/24/23 0500  Protime-INR  Tomorrow morning,   R        03/23/23 1514   03/24/23 0000  Hemoglobin  Now then every 8 hours,   R (with TIMED occurrences)      03/23/23 1511   03/24/23 0000  Hematocrit  Now then every 8 hours,   R (with TIMED occurrences)      03/23/23 1511   03/23/23 1444  Prepare RBC (crossmatch)  (Blood Administration Adult)  Once,   R       Question Answer Comment  # of Units 1 unit   Transfusion Indications Hemoglobin < 7 gm/dL and symptomatic   Number of Units to Keep Ahead NO units ahead   If emergent release call blood bank Not emergent release      03/23/23 1444            Vitals/Pain Today's Vitals   03/23/23 1112 03/23/23 1126 03/23/23 1300  BP: 126/68  115/67  Pulse: 89  80  Resp: 18  18  Temp: 98.7 F (37.1 C)    TempSrc: Oral    SpO2: 100%  100%  Weight:  180 lb (81.6 kg)   Height:  5\' 11"  (1.803 m)   PainSc:  0-No pain     Isolation Precautions No active isolations  Medications Medications  pantoprazole (PROTONIX) 80 mg /NS 100 mL IVPB (has no administration in time range)  pantoprozole (PROTONIX) 80 mg /NS 100 mL infusion (  has no administration in time range)  0.9 %  sodium chloride infusion (Manually program via Guardrails IV Fluids) (has no administration in time range)  LORazepam (ATIVAN) tablet 1-4 mg (has no administration in time range)  thiamine (VITAMIN B1) tablet 100 mg (has no administration in time range)    Or  thiamine  (VITAMIN B1) injection 100 mg (has no administration in time range)  folic acid (FOLVITE) tablet 1 mg (has no administration in time range)  multivitamin with minerals tablet 1 tablet (has no administration in time range)  LORazepam (ATIVAN) tablet 0-4 mg (has no administration in time range)    Followed by  LORazepam (ATIVAN) tablet 0-4 mg (has no administration in time range)  sodium chloride flush (NS) 0.9 % injection 3 mL (has no administration in time range)  lactated ringers infusion (has no administration in time range)  acetaminophen (TYLENOL) tablet 650 mg (has no administration in time range)    Or  acetaminophen (TYLENOL) suppository 650 mg (has no administration in time range)  ondansetron (ZOFRAN) tablet 4 mg (has no administration in time range)    Or  ondansetron (ZOFRAN) injection 4 mg (has no administration in time range)  lactated ringers bolus 1,000 mL (1,000 mLs Intravenous New Bag/Given 03/23/23 1303)    Mobility walks

## 2023-03-23 NOTE — H&P (Signed)
History and Physical    Ian Arnold HQI:[id number] DOB: 06/23/1966 DOA: 03/23/2023  PCP: System, Provider Not In   Patient coming from: Home   Chief Complaint: Dark stool, lightheaded   HPI: Ian Arnold is a 57 y.o. male with medical history significant for thyroid disease, GERD, and alcohol abuse, now presenting with melena and lightheadedness.  Patient reports that he been constipated for roughly 10 days and taking stool softeners, eventually with good effect.  He had several bowel movements without melena or hematochezia before developing large volume of black loose stool on 7/18 or 03/19/2023.  Since then, he has been having more formed but still black stool.  He has developed lightheadedness and fatigue.  Denies any abdominal pain or hematemesis.  He has been using NSAIDs regularly.  He acknowledges drinking alcohol excessively but none in 3 days now.  ED Course: Upon arrival to the ED, patient is found to be afebrile and saturating well on room air with normal heart rate and stable blood pressure.  Labs are most notable for hemoglobin 6.5.  FOBT is positive.  GI (Dr. Dulce Sellar) was consulted by the ED physician and the patient was given started on IV Protonix.  1 unit of packed RBC was ordered for transfusion.  Review of Systems:  All other systems reviewed and apart from HPI, are negative.  Past Medical History:  Diagnosis Date   GERD (gastroesophageal reflux disease)    tums   Hypothyroidism    has never been on medication   Thyroid disease     Past Surgical History:  Procedure Laterality Date   HERNIA REPAIR     INGUINAL HERNIA REPAIR  07/18/2012   Procedure: HERNIA REPAIR INGUINAL ADULT;  Surgeon: Robyne Askew, MD;  Location: [facility] OR;  Service: General;  Laterality: Right;   INSERTION OF MESH  07/18/2012   Procedure: INSERTION OF MESH;  Surgeon: Robyne Askew, MD;  Location: [facility] OR;  Service: General;  Laterality: N/A;   KNEE ARTHROSCOPY     KNEE RECONSTRUCTION      RECONSTRUCTION OF NOSE      Social History:   reports that he quit smoking about 28 years ago. His smoking use included cigarettes. He started smoking about 28 years ago. He has a 21 pack-year smoking history. He has never used smokeless tobacco. He reports that he does not drink alcohol and does not use drugs.  Allergies  Allergen Reactions   Measles Virus Live Vaccine Other (See Comments)    As a child    History reviewed. No pertinent family history.   Prior to Admission medications   Medication Sig Start Date End Date Taking? Authorizing Provider  amoxicillin-clavulanate (AUGMENTIN) 875-125 MG tablet Take 1 tablet by mouth 2 (two) times daily. One po bid x 7 days 05/12/21   Muthersbaugh, Dahlia Client, PA-C  fluticasone [facility]) 50 MCG/ACT nasal spray Place 2 sprays into both nostrils daily. 05/12/21   Muthersbaugh, Dahlia Client, PA-C  Melatonin 1 MG TABS Take 1 tablet by mouth at bedtime as needed.    [provider]  oxyCODONE-acetaminophen (PERCOCET/ROXICET) 5-325 MG tablet Take 1 tablet by mouth every 4 (four) hours as needed for severe pain. 06/07/16   Barrett Henle, PA-C  traMADol (ULTRAM) 50 MG tablet Take 1 tablet (50 mg total) by mouth every 6 (six) hours as needed. 09/22/14   Santiago Glad, PA-C    Physical Exam: Vitals:   03/23/23 1112 03/23/23 1126 03/23/23 1300  BP: 126/68  115/67  Pulse: 89  80  Resp: 18  18  Temp: 98.7 F (37.1 C)    TempSrc: Oral    SpO2: 100%  100%  Weight:  81.6 kg   Height:  5\' 11"  (1.803 m)      Constitutional: NAD, no pallor or diaphoresis   Eyes: PERTLA, lids and conjunctivae normal ENMT: Mucous membranes are moist. Posterior pharynx clear of any exudate or lesions.   Neck: supple, no masses  Respiratory: no wheezing, no crackles. No accessory muscle use.  Cardiovascular: S1 & S2 heard, regular rate and rhythm. No extremity edema.   Abdomen: No distension, no tenderness, soft. Bowel sounds active.  Musculoskeletal: no  clubbing / cyanosis. No joint deformity upper and lower extremities.   Skin: no significant rashes, lesions, ulcers. Warm, dry, well-perfused. Neurologic: CN 2-12 grossly intact. Moving all extremities. Alert and oriented.  Psychiatric: Pleasant. Cooperative.    Labs and Imaging on Admission: I have personally reviewed following labs and imaging studies  CBC: Recent Labs  Lab 03/23/23 1300  WBC 9.8  HGB 6.5*  HCT 19.1*  MCV 103.2*  PLT 227   Basic Metabolic Panel: Recent Labs  Lab 03/23/23 1300  NA 132*  K 4.0  CL 100  CO2 25  GLUCOSE 99  BUN 10  CREATININE 0.79  CALCIUM 8.5*   GFR: Estimated Creatinine Clearance: 105.9 mL/min (by C-G formula based on SCr of 0.79 mg/dL). Liver Function Tests: Recent Labs  Lab 03/23/23 1300  AST 53*  ALT 53*  ALKPHOS 48  BILITOT 0.5  PROT 5.9*  ALBUMIN 3.6   No results for input(s): "LIPASE", "AMYLASE" in the last 168 hours. No results for input(s): "AMMONIA" in the last 168 hours. Coagulation Profile: No results for input(s): "INR", "PROTIME" in the last 168 hours. Cardiac Enzymes: No results for input(s): "CKTOTAL", "CKMB", "CKMBINDEX", "TROPONINI" in the last 168 hours. BNP (last 3 results) No results for input(s): "PROBNP" in the last 8760 hours. HbA1C: No results for input(s): "HGBA1C" in the last 72 hours. CBG: No results for input(s): "GLUCAP" in the last 168 hours. Lipid Profile: No results for input(s): "CHOL", "HDL", "LDLCALC", "TRIG", "CHOLHDL", "LDLDIRECT" in the last 72 hours. Thyroid Function Tests: No results for input(s): "TSH", "T4TOTAL", "FREET4", "T3FREE", "THYROIDAB" in the last 72 hours. Anemia Panel: No results for input(s): "VITAMINB12", "FOLATE", "FERRITIN", "TIBC", "IRON", "RETICCTPCT" in the last 72 hours. Urine analysis:    Component Value Date/Time   COLORURINE AMBER (A) 12/21/2021 1538   APPEARANCEUR CLEAR 12/21/2021 1538   LABSPEC 1.024 12/21/2021 1538   PHURINE 6.0 12/21/2021 1538    GLUCOSEU NEGATIVE 12/21/2021 1538   HGBUR SMALL (A) 12/21/2021 1538   BILIRUBINUR NEGATIVE 12/21/2021 1538   KETONESUR 20 (A) 12/21/2021 1538   PROTEINUR 100 (A) 12/21/2021 1538   NITRITE NEGATIVE 12/21/2021 1538   LEUKOCYTESUR NEGATIVE 12/21/2021 1538   Sepsis Labs: @LABRCNTIP (procalcitonin:4,lacticidven:4) )No results found for this or any previous visit (from the past 240 hour(s)).   Radiological Exams on Admission: No results found.   Assessment/Plan   1. Upper GI bleeding; acute blood-loss anemia  - Continue bowel rest and IV PPI, trend H&H, transfuse additional RBC as needed    8. Alcohol abuse  - Monitor with CIWA scoring, use Ativan if needed, supplement vitamins     DVT prophylaxis: SCDs  Code Status: Full  Level of Care: Level of care: Telemetry Family Communication: none present  Disposition Plan:  Patient is from: home  Anticipated d/c is to: Home  Anticipated d/c date is: 03/26/23  Patient currently: Pending GI consultation, stable H&H  Consults called: GI  Admission status: Inpatient     Briscoe Deutscher, MD [facility]  03/23/2023, 3:11 PM

## 2023-03-23 NOTE — ED Triage Notes (Signed)
Pt reports recent constipation resolving on Thursday with melena noted soon after. Pt also reports intermittent ringing and pounding in his ears. Pt also reports recent binge of tequila with last drink 3 days ago.

## 2023-03-23 NOTE — ED Notes (Signed)
Notified Dr. Lynelle Doctor pts HGB 6.4

## 2023-03-23 NOTE — ED Provider Notes (Signed)
[place] EMERGENCY DEPARTMENT AT [facility] Provider Note   CSN: [id number] Arrival date & time: 03/23/23  1050     History  Chief Complaint  Patient presents with   Melena   Tinnitus    Ian Arnold is a 57 y.o. male.  HPI   Patient has a history of thyroid disease, acid reflux.  Patient also admits to recent alcohol binge drinking.  He was drinking tequila for the last few days.  Patient states he has not had any more to drink in the last few days other than 1 beer on Sunday.  Patient states he was having a lot of trouble with constipation last Thursday.  Medications and was ultimately able to have a bowel movement.  Patient states initially his stool was normal.  During the episode of him having difficulty with constipation and pushing he developed nosebleed.  Patient states the last couple days he has noticed dark-colored stools.  He also started feeling somewhat lightheaded and had a ringing and pounding in his ears.  Felt like his heart was racing.  Pounding has decreased but he still has a slight ringing.  Patient was concerned about the possibility of alcohol withdrawal or other complicating features so he came today to be checked.  He denies any abdominal pain.  No fevers  Home Medications Prior to Admission medications   Medication Sig Start Date End Date Taking? Authorizing Provider  amoxicillin-clavulanate (AUGMENTIN) 875-125 MG tablet Take 1 tablet by mouth 2 (two) times daily. One po bid x 7 days 05/12/21   Muthersbaugh, Dahlia Client, PA-C  fluticasone [facility]) 50 MCG/ACT nasal spray Place 2 sprays into both nostrils daily. 05/12/21   Muthersbaugh, Dahlia Client, PA-C  Melatonin 1 MG TABS Take 1 tablet by mouth at bedtime as needed.    [provider]  oxyCODONE-acetaminophen (PERCOCET/ROXICET) 5-325 MG tablet Take 1 tablet by mouth every 4 (four) hours as needed for severe pain. 06/07/16   Barrett Henle, PA-C  traMADol (ULTRAM) 50 MG tablet Take 1 tablet  (50 mg total) by mouth every 6 (six) hours as needed. 09/22/14   Santiago Glad, PA-C      Allergies    Measles virus live vaccine    Review of Systems   Review of Systems  Physical Exam Updated Vital Signs BP 115/67   Pulse 80   Temp 98.7 F (37.1 C) (Oral)   Resp 18   Ht 1.803 m (5\' 11" )   Wt 81.6 kg   SpO2 100%   BMI 25.10 kg/m  Physical Exam Vitals and nursing note reviewed.  Constitutional:      General: He is not in acute distress.    Appearance: He is well-developed.  HENT:     Head: Normocephalic and atraumatic.     Right Ear: External ear normal.     Left Ear: External ear normal.  Eyes:     General: No scleral icterus.       Right eye: No discharge.        Left eye: No discharge.     Conjunctiva/sclera: Conjunctivae normal.  Neck:     Trachea: No tracheal deviation.  Cardiovascular:     Rate and Rhythm: Normal rate and regular rhythm.  Pulmonary:     Effort: Pulmonary effort is normal. No respiratory distress.     Breath sounds: Normal breath sounds. No stridor. No wheezing or rales.  Abdominal:     General: Bowel sounds are normal. There is no distension.  Palpations: Abdomen is soft.     Tenderness: There is no abdominal tenderness. There is no guarding or rebound.  Genitourinary:    Comments: No mass appreciated, no melena noted on rectal exam Musculoskeletal:        General: No tenderness or deformity.     Cervical back: Neck supple.  Skin:    General: Skin is warm and dry.     Findings: No rash.  Neurological:     General: No focal deficit present.     Mental Status: He is alert.     Cranial Nerves: No cranial nerve deficit, dysarthria or facial asymmetry.     Sensory: No sensory deficit.     Motor: No abnormal muscle tone or seizure activity.     Coordination: Coordination normal.  Psychiatric:        Mood and Affect: Mood normal.     ED Results / Procedures / Treatments   Labs (all labs ordered are listed, but only abnormal  results are displayed) Labs Reviewed  CBC - Abnormal; Notable for the following components:      Result Value   RBC 1.85 (*)    Hemoglobin 6.5 (*)    HCT 19.1 (*)    MCV 103.2 (*)    MCH 35.1 (*)    All other components within normal limits  COMPREHENSIVE METABOLIC PANEL - Abnormal; Notable for the following components:   Sodium 132 (*)    Calcium 8.5 (*)    Total Protein 5.9 (*)    AST 53 (*)    ALT 53 (*)    All other components within normal limits  POC OCCULT BLOOD, ED - Abnormal; Notable for the following components:   Fecal Occult Bld POSITIVE (*)    All other components within normal limits  HEMOGLOBIN  HEMATOCRIT  TYPE AND SCREEN  PREPARE RBC (CROSSMATCH)  ABO/RH    EKG None  Radiology No results found.  Procedures .Critical Care  Performed by: Linwood Dibbles, MD Authorized by: Linwood Dibbles, MD   Critical care provider statement:    Critical care time (minutes):  30   Critical care was time spent personally by me on the following activities:  Development of treatment plan with patient or surrogate, discussions with consultants, evaluation of patient's response to treatment, examination of patient, ordering and review of laboratory studies, ordering and review of radiographic studies, ordering and performing treatments and interventions, pulse oximetry, re-evaluation of patient's condition and review of old charts     Medications Ordered in ED Medications  pantoprozole (PROTONIX) 80 mg /NS 100 mL infusion (has no administration in time range)  0.9 %  sodium chloride infusion (Manually program via Guardrails IV Fluids) (has no administration in time range)  LORazepam (ATIVAN) tablet 1-4 mg (has no administration in time range)  thiamine (VITAMIN B1) tablet 100 mg (has no administration in time range)    Or  thiamine (VITAMIN B1) injection 100 mg (has no administration in time range)  folic acid (FOLVITE) tablet 1 mg (has no administration in time range)   multivitamin with minerals tablet 1 tablet (has no administration in time range)  LORazepam (ATIVAN) tablet 0-4 mg (has no administration in time range)    Followed by  LORazepam (ATIVAN) tablet 0-4 mg (has no administration in time range)  sodium chloride flush (NS) 0.9 % injection 3 mL (has no administration in time range)  lactated ringers infusion (has no administration in time range)  acetaminophen (TYLENOL) tablet 650 mg (has  no administration in time range)    Or  acetaminophen (TYLENOL) suppository 650 mg (has no administration in time range)  ondansetron (ZOFRAN) tablet 4 mg (has no administration in time range)    Or  ondansetron (ZOFRAN) injection 4 mg (has no administration in time range)  lactated ringers bolus 1,000 mL (1,000 mLs Intravenous New Bag/Given 03/23/23 1303)  pantoprazole (PROTONIX) 80 mg /NS 100 mL IVPB (80 mg Intravenous New Bag/Given 03/23/23 1540)    ED Course/ Medical Decision Making/ A&P Clinical Course as of 03/23/23 1603  Tue Mar 23, 2023  1316 Hemoccult positive [JK]  1431 CBC(!!) Hemoglobin decreased at 6.5.  Metabolic panel normal [JK]  1505 Case discussed with Dr Dulce Sellar, GI.  Will see pt in consultation [JK]  1602 Case discussed with Dr Antionette Char [JK]    Clinical Course User Index [JK] Linwood Dibbles, MD                             Medical Decision Making Problems Addressed: Gastrointestinal hemorrhage, unspecified gastrointestinal hemorrhage type: acute illness or injury that poses a threat to life or bodily functions  Amount and/or Complexity of Data Reviewed Labs: ordered. Decision-making details documented in ED Course.  Risk Prescription drug management. Decision regarding hospitalization.   Patient presented to the ED with concerns for GI bleeding.  Patient had noticed a large amount of dark-colored stool.  He has noted some lightheadedness and pounding in his ear since that time.  In the ED vital signs are stable.  Patient however does  have guaiac positive stools.  His hemoglobin is also decreased at 6.5.  Patient admits to alcohol and NSAID use.  Presentation concerning for acute upper GI bleeding.  I have ordered IV Protonix.  Have ordered a blood transfusion.  I will consult with GI and the medical service for admission.        Final Clinical Impression(s) / ED Diagnoses Final diagnoses:  Gastrointestinal hemorrhage, unspecified gastrointestinal hemorrhage type    Rx / DC Orders ED Discharge Orders     None         Linwood Dibbles, MD 03/23/23 [phone]

## 2023-03-24 ENCOUNTER — Inpatient Hospital Stay (HOSPITAL_COMMUNITY): Payer: Medicaid Other | Admitting: Anesthesiology

## 2023-03-24 ENCOUNTER — Encounter (HOSPITAL_COMMUNITY)
Admission: EM | Disposition: A | Discharge status: Home / Self Care | Payer: Self-pay | Source: Home / Self Care | Attending: Internal Medicine

## 2023-03-24 DIAGNOSIS — K2970 Gastritis, unspecified, without bleeding: Secondary | ICD-10-CM

## 2023-03-24 DIAGNOSIS — E039 Hypothyroidism, unspecified: Secondary | ICD-10-CM

## 2023-03-24 DIAGNOSIS — K269 Duodenal ulcer, unspecified as acute or chronic, without hemorrhage or perforation: Secondary | ICD-10-CM

## 2023-03-24 DIAGNOSIS — K3189 Other diseases of stomach and duodenum: Secondary | ICD-10-CM

## 2023-03-24 HISTORY — PX: ESOPHAGOGASTRODUODENOSCOPY: SHX5428

## 2023-03-24 HISTORY — PX: BIOPSY: SHX5522

## 2023-03-24 LAB — IRON AND TIBC
Iron: 39 ug/dL — ABNORMAL LOW (ref 45–182)
Saturation Ratios: 10 % — ABNORMAL LOW (ref 17.9–39.5)
TIBC: 391 ug/dL (ref 250–450)
UIBC: 352 ug/dL

## 2023-03-24 LAB — PROTIME-INR
INR: 1 (ref 0.8–1.2)
Prothrombin Time: 13.6 seconds (ref 11.4–15.2)

## 2023-03-24 LAB — BPAM RBC
ISSUE DATE / TIME: 202407232250
Unit Type and Rh: 5100
Unit Type and Rh: 5100

## 2023-03-24 LAB — COMPREHENSIVE METABOLIC PANEL
ALT: 55 U/L — ABNORMAL HIGH (ref 0–44)
AST: 51 U/L — ABNORMAL HIGH (ref 15–41)
Albumin: 3.3 g/dL — ABNORMAL LOW (ref 3.5–5.0)
Alkaline Phosphatase: 46 U/L (ref 38–126)
Anion gap: 8 (ref 5–15)
BUN: 6 mg/dL (ref 6–20)
CO2: 25 mmol/L (ref 22–32)
Calcium: 8.4 mg/dL — ABNORMAL LOW (ref 8.9–10.3)
Chloride: 103 mmol/L (ref 98–111)
Creatinine, Ser: 0.78 mg/dL (ref 0.61–1.24)
GFR, Estimated: >60 mL/min (ref >60)
Glucose, Bld: 94 mg/dL (ref 70–99)
Potassium: 3.7 mmol/L (ref 3.5–5.1)
Sodium: 136 mmol/L (ref 135–145)
Total Bilirubin: 0.6 mg/dL (ref 0.3–1.2)
Total Protein: 5.6 g/dL — ABNORMAL LOW (ref 6.5–8.1)

## 2023-03-24 LAB — TYPE AND SCREEN

## 2023-03-24 LAB — HEMOGLOBIN AND HEMATOCRIT, BLOOD
HCT: 24.1 % — ABNORMAL LOW (ref 39.0–52.0)
Hemoglobin: 8 g/dL — ABNORMAL LOW (ref 13.0–17.0)

## 2023-03-24 LAB — FOLATE: Folate: >40 ng/mL (ref >5.9)

## 2023-03-24 LAB — RETICULOCYTES
Immature Retic Fract: 39.6 % — ABNORMAL HIGH (ref 2.3–15.9)
RBC.: 2.39 MIL/uL — ABNORMAL LOW (ref 4.22–5.81)
Retic Count, Absolute: 160.4 10*3/uL (ref 19.0–186.0)
Retic Ct Pct: 6.7 % — ABNORMAL HIGH (ref 0.4–3.1)

## 2023-03-24 LAB — HIV ANTIBODY (ROUTINE TESTING W REFLEX): HIV Screen 4th Generation wRfx: NONREACTIVE

## 2023-03-24 LAB — VITAMIN B12: Vitamin B-12: 551 pg/mL (ref 180–914)

## 2023-03-24 LAB — FERRITIN: Ferritin: 78 ng/mL (ref 24–336)

## 2023-03-24 LAB — HEMATOCRIT
HCT: 21.1 % — ABNORMAL LOW (ref 39.0–52.0)
HCT: 26 % — ABNORMAL LOW (ref 39.0–52.0)

## 2023-03-24 LAB — TROPONIN I (HIGH SENSITIVITY)
Troponin I (High Sensitivity): 3 ng/L (ref <18)
Troponin I (High Sensitivity): 3 ng/L (ref <18)

## 2023-03-24 LAB — HEMOGLOBIN: Hemoglobin: 7.1 g/dL — ABNORMAL LOW (ref 13.0–17.0)

## 2023-03-24 LAB — MAGNESIUM: Magnesium: 2.2 mg/dL (ref 1.7–2.4)

## 2023-03-24 LAB — PREPARE RBC (CROSSMATCH)

## 2023-03-24 SURGERY — EGD (ESOPHAGOGASTRODUODENOSCOPY)
Anesthesia: Monitor Anesthesia Care

## 2023-03-24 MED ORDER — SODIUM CHLORIDE 0.9 % IV SOLN: INTRAVENOUS | Status: DC

## 2023-03-24 MED ORDER — LACTATED RINGERS IV SOLN: INTRAVENOUS | Status: AC

## 2023-03-24 MED ORDER — PROPOFOL 500 MG/50ML IV EMUL
INTRAVENOUS | Status: DC | PRN
  Administered 2023-03-24: 150 ug/kg/min via INTRAVENOUS

## 2023-03-24 MED ORDER — PROPOFOL 10 MG/ML IV BOLUS
INTRAVENOUS | Status: DC | PRN
Start: 2023-03-24 — End: 2023-03-24
  Administered 2023-03-24: 50 mg via INTRAVENOUS

## 2023-03-24 MED ORDER — NITROGLYCERIN 0.4 MG SL SUBL: 0.4000 mg | SUBLINGUAL_TABLET | SUBLINGUAL | Status: DC | PRN

## 2023-03-24 MED ORDER — PANTOPRAZOLE SODIUM 40 MG PO TBEC
40.0000 mg | DELAYED_RELEASE_TABLET | Freq: Two times a day (BID) | ORAL | Status: DC
  Administered 2023-03-24 – 2023-03-26 (×4): 40 mg via ORAL
  Filled 2023-03-24 (×5): qty 1

## 2023-03-24 MED ORDER — SODIUM CHLORIDE 0.9% IV SOLUTION: Freq: Once | INTRAVENOUS | Status: AC

## 2023-03-24 MED ORDER — LORAZEPAM 1 MG PO TABS: 1.0000 mg | ORAL_TABLET | Freq: Three times a day (TID) | ORAL | Status: DC | PRN

## 2023-03-24 MED ORDER — LIDOCAINE HCL (CARDIAC) PF 100 MG/5ML IV SOSY
PREFILLED_SYRINGE | INTRAVENOUS | Status: DC | PRN
  Administered 2023-03-24: 50 mg via INTRAVENOUS

## 2023-03-24 NOTE — Interval H&P Note (Signed)
History and Physical Interval Note:  03/24/2023 2:25 PM  Ian Arnold  has presented today for surgery, with the diagnosis of melena.  The various methods of treatment have been discussed with the patient and family. After consideration of risks, benefits and other options for treatment, the patient has consented to  Procedure(s): ESOPHAGOGASTRODUODENOSCOPY (EGD) (N/A) as a surgical intervention.  The patient's history has been reviewed, patient examined, no change in status, stable for surgery.  I have reviewed the patient's chart and labs.  Questions were answered to the patient's satisfaction.     Freddy Jaksch

## 2023-03-24 NOTE — Transfer of Care (Signed)
Immediate Anesthesia Transfer of Care Note  Patient: Ian Arnold  Procedure(s) Performed: ESOPHAGOGASTRODUODENOSCOPY (EGD) BIOPSY  Patient Location: Short Stay  Anesthesia Type:MAC  Level of Consciousness: awake, alert , oriented, and patient cooperative  Airway & Oxygen Therapy: Patient Spontanous Breathing and Patient connected to face mask oxygen  Post-op Assessment: Report given to RN, Post -op Vital signs reviewed and stable, and Patient moving all extremities X 4  Post vital signs: Reviewed and stable  Last Vitals:  Vitals Value Taken Time  BP 81/51 03/24/23 1504  Temp    Pulse 66 03/24/23 1504  Resp 10 03/24/23 1504  SpO2 100 % 03/24/23 1504  Vitals shown include unfiled device data.  Last Pain:  Vitals:   03/24/23 1421  TempSrc: Temporal  PainSc: 0-No pain         Complications: No notable events documented.

## 2023-03-24 NOTE — TOC Initial Note (Signed)
Transition of Care [facility]) - Initial/Assessment Note    Patient Details  Name: Ian Arnold MRN: [id number] Date of Birth: [birth year]  [facility]) CM/SW Contact:    Larrie Kass, LCSW Phone Number: 03/24/2023, 5:28 PM  Clinical Narrative:                 CSW spoke with pt regarding SDOH food insecurities and transportation assistance. Pt denies having any food insecurities, he reports knowing where the food banks are in his area but reports having transportation issues. CSW informed pt he could receive a bus pass at the time of d/c. CSW inquired if pt is in the process of obtaining insurance coverage. Pt reported he is not but he knows where to go to apply for Medicaid. Pt reports no PCP at this time. Pt agreed with CSW to assist with making an appointment to establish primary care. TOC to follow.     Barriers to Discharge: Continued Medical Work up   Patient Goals and CMS Choice Patient states their goals for this hospitalization and ongoing recovery are:: retrun home          Expected Discharge Plan and Services In-house Referral: Clinical Social Work     Living arrangements for the past 2 months: Single Family Home                                      Prior Living Arrangements/Services Living arrangements for the past 2 months: Single Family Home Lives with:: Self   Do you feel safe going back to the place where you live?: Yes      Need for Family Participation in Patient Care: No (Comment) Care giver support system in place?: No (comment)   Criminal Activity/Legal Involvement Pertinent to Current Situation/Hospitalization: No - Comment as needed  Activities of Daily Living Home Assistive Devices/Equipment: None ADL Screening (condition at time of admission) Patient's cognitive ability adequate to safely complete daily activities?: Yes Is the patient deaf or have difficulty hearing?: No Does the patient have difficulty seeing, even when  wearing glasses/contacts?: No Does the patient have difficulty concentrating, remembering, or making decisions?: No Patient able to express need for assistance with ADLs?: Yes Does the patient have difficulty dressing or bathing?: No Independently performs ADLs?: Yes (appropriate for developmental age) Does the patient have difficulty walking or climbing stairs?: No Weakness of Legs: None Weakness of Arms/Hands: None  Permission Sought/Granted                  Emotional Assessment Appearance:: Appears stated age Attitude/Demeanor/Rapport: Gracious Affect (typically observed): Accepting Orientation: : Oriented to Place, Oriented to Self, Oriented to  Time, Oriented to Situation   Psych Involvement: No (comment)  Admission diagnosis:  Upper GI bleed [K92.2] Gastrointestinal hemorrhage, unspecified gastrointestinal hemorrhage type [K92.2] Patient Active Problem List   Diagnosis Date Noted   Upper GI bleed 03/23/2023   Acute blood loss anemia 03/23/2023   Alcohol abuse 03/23/2023   Right inguinal hernia 07/07/2012   PCP:  System, Provider Not In Pharmacy:   [facility] [address], [place] - [address] [place] [address] Phone: [phone] Fax: [phone]  [facility] - [place], [place] - [address] AT [facility] [address] [place] [address] Phone: [phone] Fax: [phone]  [facility] [address], [place] - [address]  [address] [place] [address] Phone: [phone] Fax: [phone]     Social Determinants of Health (SDOH) Social History: SDOH Screenings   Food Insecurity: Food Insecurity Present (03/23/2023)  Housing: Low Risk  (03/23/2023)  Transportation Needs: Unmet Transportation Needs (03/23/2023)  Utilities: Not At Risk (03/23/2023)  Tobacco Use: Medium Risk (03/23/2023)   SDOH Interventions: Food Insecurity Interventions: Inpatient TOC,  Intervention Not Indicated   Readmission Risk Interventions     No data to display

## 2023-03-24 NOTE — Progress Notes (Signed)
       CROSS COVER NOTE  NAME: Ian Arnold MRN: [id number] DOB : [birth year] ATTENDING PHYSICIAN: Briscoe Deutscher, MD    Date of Service   03/24/2023   HPI/Events of Note   Report/Request Ian Arnold is reporting 4/10 Dull central chest pain.  HPI 57 y.o. male with medical history significant for thyroid disease, GERD, and alcohol abuse, now presenting with melena and lightheadedness. Last Hgb was 7.1 at 0426.   Today's Vitals   03/24/23 0017 03/24/23 0111 03/24/23 0500 03/24/23 0507  BP: 113/76 105/71  123/78  Pulse: 65   62  Resp: 18 16  18   Temp: 98.1 F (36.7 C) 98.2 F (36.8 C)  98.1 F (36.7 C)  TempSrc: Oral Oral  Oral  SpO2: 99% 98%  100%  Weight:   80 kg   Height:      PainSc:        Interventions   Assessment/Plan: EKG --> NSR, non-ischemic Troponin Transfuse 1U PRBC SL Nitro PRN       To reach the provider On-Call:   7AM- 7PM see care teams to locate the attending and reach out to them via [email or web address] Password: [email or web address] 7PM-7AM contact night-coverage If you still have difficulty reaching the appropriate provider, please page the [facility] (Director on Call) for [facility] on amion for assistance  This document was prepared using Conservation officer, historic buildings and may include unintentional dictation errors.  Bishop Limbo FNP-BC, PMHNP-BC Nurse Practitioner [facility] Pager [phone]

## 2023-03-24 NOTE — Op Note (Signed)
[facility] Patient Name: Ian Arnold Procedure Date: 03/24/2023 MRN: [id number] Attending MD: Willis Modena , MD, [id number] Date of Birth: 1965-09-29 CSN: [id number] Age: 57 Admit Type: Inpatient Procedure:                Upper GI endoscopy Indications:              Acute post hemorrhagic anemia, Melena Providers:                Willis Modena, MD, Fransisca Connors, Priscella Mann,                            Technician Referring MD:             [facility] Medicines:                Monitored Anesthesia Care Complications:            No immediate complications. Estimated Blood Loss:     Estimated blood loss: none. Procedure:                Pre-Anesthesia Assessment:                           - Prior to the procedure, a History and Physical                            was performed, and patient medications and                            allergies were reviewed. The patient's tolerance of                            previous anesthesia was also reviewed. The risks                            and benefits of the procedure and the sedation                            options and risks were discussed with the patient.                            All questions were answered, and informed consent                            was obtained. Prior Anticoagulants: The patient has                            taken no anticoagulant or antiplatelet agents                            except for NSAID medication. ASA Grade Assessment:                            III - A patient with severe systemic disease. After  reviewing the risks and benefits, the patient was                            deemed in satisfactory condition to undergo the                            procedure.                           After obtaining informed consent, the endoscope was                            passed under direct vision. Throughout the                            procedure, the  patient's blood pressure, pulse, and                            oxygen saturations were monitored continuously. The                            [id number]-H190 ([id number]) Olympus endoscope was introduced                            through the mouth, and advanced to the second part                            of duodenum. The upper GI endoscopy was                            accomplished without difficulty. The patient                            tolerated the procedure well. Scope In: Scope Out: Findings:      The examined esophagus was normal.      Patchy mild inflammation characterized by erosions was found in the       gastric fundus, in the gastric body and in the gastric antrum. Biopsies       were taken with a cold forceps for histology.      The exam of the stomach was otherwise normal.      Patchy mild mucosal changes characterized by congestion and erythema       were found in the duodenal bulb and in the first portion of the duodenum.      Three non-bleeding cratered duodenal ulcers with no stigmata of bleeding       were found in the first portion of the duodenum. The largest lesion was       5 mm in largest dimension.      The exam of the duodenum was otherwise normal.      No old or fresh blood was seen to the extent of our examination. Impression:               - Normal esophagus.                           -  Gastritis. Biopsied.                           - Mucosal changes in the duodenum.                           - Non-bleeding duodenal ulcers with no stigmata of                            bleeding. Suspect NSAID ulcers. Moderate Sedation:      None Recommendation:           - Return patient to hospital ward for ongoing care.                           - Soft diet today.                           - Continue present medications.                           - Await pathology results, specifically checking                            for H. pylori.                           - Patient  will eventually need outpatient                            colonoscopy.                           [facility] will follow. Procedure Code(s):        --- Professional ---                           [phone], Esophagogastroduodenoscopy, flexible,                            transoral; with biopsy, single or multiple Diagnosis Code(s):        --- Professional ---                           K29.70, Gastritis, unspecified, without bleeding                           K31.89, Other diseases of stomach and duodenum                           K26.9, Duodenal ulcer, unspecified as acute or                            chronic, without hemorrhage or perforation                           D62, Acute posthemorrhagic anemia  K92.1, Melena (includes Hematochezia) CPT copyright 2022 American [employer] Association. All rights reserved. The codes documented in this report are preliminary and upon coder review may  be revised to meet current compliance requirements. Willis Modena, MD 03/24/2023 3:02:33 PM This report has been signed electronically. Number of Addenda: 0

## 2023-03-24 NOTE — Progress Notes (Signed)
[facility]                                                                               Ian Arnold, is a 57 y.o. male, DOB - 1966/05/29, QMG:[id number] Admit date - 03/23/2023    Outpatient Primary MD for the patient is System, Provider Not In  LOS - 1  days    Brief summary    57 y.o. male with medical history significant for thyroid disease, GERD, and alcohol abuse, now presenting with melena and lightheadedness.   GI (Dr. Dulce Sellar) was consulted by the ED physician and the patient was given started on IV Protonix. 2 unit of packed RBC was ordered for transfusion.   Assessment & Plan    Assessment and Plan:   Symptomatic anemia/ acute anemia of blood loss: 2 units of prbc transfusion.  Recheck H&h post transfusion.  Transfuse to keep hemoglobin greater than 7.  Anemia panel ordered.  GI consulted and plan for EGD later today.  Continue with IV PPI.   Alcohol abuse:  On CIWA protocol.    Alcoholic hepatitis:  Mildly elevated liver enzymes.  Repeat liver panel in one week.       Estimated body mass index is 24.6 kg/m as calculated from the following:   Height as of this encounter: 5\' 11"  (1.803 m).   Weight as of this encounter: 80 kg.  Code Status: full code.  DVT Prophylaxis:  SCDs Start: 03/23/23 1510   Level of Care: Level of care: Med-Surg Family Communication: None at bedside.  Disposition Plan:     Remains inpatient appropriate:  pending   Procedures:  None.   Consultants:   Gastroenterology.   Antimicrobials:   Anti-infectives (From admission, onward)    None        Medications  Scheduled Meds:  folic acid  1 mg Oral Daily   LORazepam  0-4 mg Oral Q6H   Followed by   Melene Muller ON 03/25/2023] LORazepam  0-4 mg Oral Q12H   multivitamin with minerals  1 tablet Oral Daily   nicotine  21 mg Transdermal Daily   sodium chloride flush  3 mL Intravenous Q12H   thiamine  100 mg Oral Daily   Or   thiamine  100 mg Intravenous  Daily   Continuous Infusions:  sodium chloride     pantoprazole 8 mg/hr (03/24/23 0409)   PRN Meds:.acetaminophen **OR** acetaminophen, LORazepam, LORazepam, melatonin, nitroGLYCERIN, ondansetron **OR** ondansetron (ZOFRAN) IV    Subjective:   Ian Arnold was seen and examined today.  Reports being anxious.   Objective:   Vitals:   03/24/23 0507 03/24/23 0810 03/24/23 0840 03/24/23 1045  BP: 123/78 112/75 124/72 129/89  Pulse: 62 64 64 61  Resp: 18  12 10   Temp: 98.1 F (36.7 C) 98.4 F (36.9 C) 98.2 F (36.8 C) 98.2 F (36.8 C)  TempSrc: Oral Oral Oral Oral  SpO2: 100% 99% 100% 100%  Weight:      Height:        Intake/Output Summary (Last 24 hours) at 03/24/2023 1141 Last data filed at 03/24/2023 1045 Gross per 24 hour  Intake 1862.91 ml  Output 2625 ml  Net -762.09 ml   Filed Weights   03/23/23 1126 03/24/23 0500  Weight: 81.6 kg 80 kg     Exam General exam: Appears calm and comfortable  Respiratory system: Clear to auscultation. Respiratory effort normal. Cardiovascular system: S1 & S2 heard, RRR.  Gastrointestinal system: Abdomen is nondistended, soft and nontender.  Central nervous system: Alert and oriented.  Extremities: Symmetric 5 x 5 power. Skin: No rashes, lesions or ulcers Psychiatry: anxious.    Data Reviewed:  I have personally reviewed following labs and imaging studies   CBC Lab Results  Component Value Date   WBC 9.8 03/23/2023   RBC 1.85 (L) 03/23/2023   HGB 7.1 (L) 03/24/2023   HCT 21.1 (L) 03/24/2023   MCV 103.2 (H) 03/23/2023   MCH 35.1 (H) 03/23/2023   PLT 227 03/23/2023   MCHC 34.0 03/23/2023   RDW 15.0 03/23/2023     Last metabolic panel Lab Results  Component Value Date   NA 136 03/24/2023   K 3.7 03/24/2023   CL 103 03/24/2023   CO2 25 03/24/2023   BUN 6 03/24/2023   CREATININE 0.78 03/24/2023   GLUCOSE 94 03/24/2023   GFRNONAA >60 03/24/2023   GFRAA >90 07/14/2012   CALCIUM 8.4 (L) 03/24/2023   PROT 5.6  (L) 03/24/2023   ALBUMIN 3.3 (L) 03/24/2023   BILITOT 0.6 03/24/2023   ALKPHOS 46 03/24/2023   AST 51 (H) 03/24/2023   ALT 55 (H) 03/24/2023   ANIONGAP 8 03/24/2023    CBG (last 3)  No results for input(s): "GLUCAP" in the last 72 hours.    Coagulation Profile: Recent Labs  Lab 03/24/23 0426  INR 1.0     Radiology Studies: No results found.     Kathlen Mody M.D. [facility] 03/24/2023, 11:41 AM  Available via Epic secure chat 7am-7pm After 7 pm, please refer to night coverage provider listed on amion.

## 2023-03-24 NOTE — Anesthesia Preprocedure Evaluation (Signed)
Anesthesia Evaluation  Patient identified by MRN, date of birth, ID band Patient awake    Reviewed: Allergy & Precautions, H&P , NPO status , Patient's Chart, lab work & pertinent test results  Airway Mallampati: II  TM Distance: >3 FB Neck ROM: Full    Dental no notable dental hx.    Pulmonary neg pulmonary ROS, former smoker   Pulmonary exam normal breath sounds clear to auscultation       Cardiovascular negative cardio ROS Normal cardiovascular exam Rhythm:Regular Rate:Normal     Neuro/Psych negative neurological ROS  negative psych ROS   GI/Hepatic negative GI ROS,,,(+)     substance abuse  alcohol use  Endo/Other  Hypothyroidism    Renal/GU negative Renal ROS  negative genitourinary   Musculoskeletal negative musculoskeletal ROS (+)    Abdominal   Peds negative pediatric ROS (+)  Hematology  (+) Blood dyscrasia, anemia   Anesthesia Other Findings   Reproductive/Obstetrics negative OB ROS                             Anesthesia Physical Anesthesia Plan  ASA: 3  Anesthesia Plan: MAC   Post-op Pain Management: Minimal or no pain anticipated   Induction: Intravenous  PONV Risk Score and Plan: 1 and Propofol infusion and Treatment may vary due to age or medical condition  Airway Management Planned: Simple Face Mask  Additional Equipment:   Intra-op Plan:   Post-operative Plan:   Informed Consent: I have reviewed the patients History and Physical, chart, labs and discussed the procedure including the risks, benefits and alternatives for the proposed anesthesia with the patient or authorized representative who has indicated his/her understanding and acceptance.     Dental advisory given  Plan Discussed with: CRNA and Surgeon  Anesthesia Plan Comments:        Anesthesia Quick Evaluation

## 2023-03-24 NOTE — Plan of Care (Signed)

## 2023-03-24 NOTE — H&P (View-Only) (Signed)
[facility] Consultation Note  Referring Provider: [facility] Primary Care Physician:  System, Provider Not In Primary Gastroenterologist:  Unassigned  Reason for Consultation:  melena, anemia  HPI: Ian Arnold is a 57 y.o. male admitted melena, anemia.  History of constipation with straining over past few days.  During same time frame was taking NSAIDs for mouth/dental pain.  Developed several days of melenic stools.  Some generalized abdominal discomfort.  No prior endoscopy or colonoscopy.  No hematemesis.  Hgb 6.5 on admission, 7.1 after one unit prbcs, receiving second unit now.   Past Medical History:  Diagnosis Date   GERD (gastroesophageal reflux disease)    tums   Hypothyroidism    has never been on medication   Thyroid disease     Past Surgical History:  Procedure Laterality Date   HERNIA REPAIR     INGUINAL HERNIA REPAIR  07/18/2012   Procedure: HERNIA REPAIR INGUINAL ADULT;  Surgeon: Robyne Askew, MD;  Location[facility] OR;  Service: General;  Laterality: Right;   INSERTION OF MESH  07/18/2012   Procedure: INSERTION OF MESH;  Surgeon: Robyne Askew, MD;  Location: [facility] OR;  Service: General;  Laterality: N/A;   KNEE ARTHROSCOPY     KNEE RECONSTRUCTION     RECONSTRUCTION OF NOSE      Prior to Admission medications   Medication Sig Start Date End Date Taking? Authorizing Provider  docusate sodium (COLACE) 100 MG capsule Take 100 mg by mouth daily as needed for mild constipation or moderate constipation.   Yes [provider]  ibuprofen (ADVIL) 200 MG tablet Take 200 mg by mouth every 6 (six) hours as needed for cramping, moderate pain or mild pain.   Yes [provider]  Melatonin 1 MG TABS Take 1 tablet by mouth at bedtime as needed.   Yes [provider]  amoxicillin-clavulanate (AUGMENTIN) 875-125 MG tablet Take 1 tablet by mouth 2 (two) times daily. One po bid x 7 days Patient not taking: Reported on 03/24/2023 05/12/21    Muthersbaugh, Dahlia Client, PA-C  fluticasone [facility]) 50 MCG/ACT nasal spray Place 2 sprays into both nostrils daily. Patient not taking: Reported on 03/24/2023 05/12/21   Muthersbaugh, Dahlia Client, PA-C  oxyCODONE-acetaminophen (PERCOCET/ROXICET) 5-325 MG tablet Take 1 tablet by mouth every 4 (four) hours as needed for severe pain. Patient not taking: Reported on 03/24/2023 06/07/16   Barrett Henle, PA-C  traMADol (ULTRAM) 50 MG tablet Take 1 tablet (50 mg total) by mouth every 6 (six) hours as needed. Patient not taking: Reported on 03/24/2023 09/22/14   Santiago Glad, PA-C    Current Facility-Administered Medications  Medication Dose Route Frequency Provider Last Rate Last Admin   acetaminophen (TYLENOL) tablet 650 mg  650 mg Oral Q6H PRN Opyd, Lavone Neri, MD   650 mg at 03/23/23 2316   Or   acetaminophen (TYLENOL) suppository 650 mg  650 mg Rectal Q6H PRN Opyd, Lavone Neri, MD       folic acid (FOLVITE) tablet 1 mg  1 mg Oral Daily Opyd, Lavone Neri, MD   1 mg at 03/24/23 1019   lactated ringers infusion   Intravenous Continuous Opyd, Lavone Neri, MD 100 mL/hr at 03/24/23 0411 New Bag at 03/24/23 0411   LORazepam (ATIVAN) tablet 0-4 mg  0-4 mg Oral Q6H Opyd, Lavone Neri, MD   1 mg at 03/24/23 1023   Followed by   Melene Muller ON 03/25/2023] LORazepam (ATIVAN) tablet 0-4 mg  0-4 mg Oral Q12H  Opyd, Lavone Neri, MD       LORazepam (ATIVAN) tablet 1 mg  1 mg Oral Q8H PRN Kathlen Mody, MD       LORazepam (ATIVAN) tablet 1-4 mg  1-4 mg Oral Q1H PRN Opyd, Lavone Neri, MD       melatonin tablet 3 mg  3 mg Oral QHS PRN Opyd, Lavone Neri, MD   3 mg at 03/23/23 2316   multivitamin with minerals tablet 1 tablet  1 tablet Oral Daily Opyd, Lavone Neri, MD   1 tablet at 03/24/23 1019   nicotine (NICODERM CQ - dosed in mg/24 hours) patch 21 mg  21 mg Transdermal Daily Opyd, Lavone Neri, MD   21 mg at 03/24/23 1018   nitroGLYCERIN (NITROSTAT) SL tablet 0.4 mg  0.4 mg Sublingual Q5 min PRN Foust, Katy L, NP       ondansetron  (ZOFRAN) tablet 4 mg  4 mg Oral Q6H PRN Opyd, Lavone Neri, MD       Or   ondansetron (ZOFRAN) injection 4 mg  4 mg Intravenous Q6H PRN Opyd, Lavone Neri, MD       pantoprozole (PROTONIX) 80 mg /NS 100 mL infusion  8 mg/hr Intravenous Continuous Opyd, Lavone Neri, MD 10 mL/hr at 03/24/23 0409 8 mg/hr at 03/24/23 0409   sodium chloride flush (NS) 0.9 % injection 3 mL  3 mL Intravenous Q12H Opyd, Lavone Neri, MD   3 mL at 03/23/23 2300   thiamine (VITAMIN B1) tablet 100 mg  100 mg Oral Daily Opyd, Lavone Neri, MD   100 mg at 03/24/23 1019   Or   thiamine (VITAMIN B1) injection 100 mg  100 mg Intravenous Daily Opyd, Lavone Neri, MD        Allergies as of 03/23/2023 - Review Complete 03/23/2023  Allergen Reaction Noted   Measles virus live vaccine Other (See Comments) 07/07/2012    History reviewed. No pertinent family history.  Social History   Socioeconomic History   Marital status: Married    Spouse name: Not on file   Number of children: Not on file   Years of education: Not on file   Highest education level: Not on file  Occupational History   Not on file  Tobacco Use   Smoking status: Former    Current packs/day: 0.00    Average packs/day: 1.5 packs/day for 14.0 years (21.0 ttl pk-yrs)    Types: Cigarettes    Start date: 08/30/1994    Quit date: 08/31/1994    Years since quitting: 28.5   Smokeless tobacco: Never  Substance and Sexual Activity   Alcohol use: No   Drug use: No   Sexual activity: Not on file  Other Topics Concern   Not on file  Social History Narrative   Not on file   Social Determinants of Health   Financial Resource Strain: Not on file  Food Insecurity: Food Insecurity Present (03/23/2023)   Hunger Vital Sign    Worried About Running Out of Food in the Last Year: Sometimes true    Ran Out of Food in the Last Year: Never true  Transportation Needs: Unmet Transportation Needs (03/23/2023)   PRAPARE - Administrator, Civil Service (Medical): Yes    Lack  of Transportation (Non-Medical): Yes  Physical Activity: Not on file  Stress: Not on file  Social Connections: Not on file  Intimate Partner Violence: Not At Risk (03/23/2023)   Humiliation, Afraid, Rape, and Kick questionnaire    Fear  of Current or Ex-Partner: No    Emotionally Abused: No    Physically Abused: No    Sexually Abused: No    Review of Systems: As per HPI, all others negative  Physical Exam: Vital signs in last 24 hours: Temp:  [98.1 F (36.7 C)-98.9 F (37.2 C)] 98.2 F (36.8 C) (07/24 1045) Pulse Rate:  [61-89] 61 (07/24 1045) Resp:  [10-20] 10 (07/24 1045) BP: (105-133)/(67-89) 129/89 (07/24 1045) SpO2:  [97 %-100 %] 100 % (07/24 1045) Weight:  [80 kg-81.6 kg] 80 kg (07/24 0500) Last BM Date : 03/22/23 General:   Alert,  Well-developed, well-nourished, pleasant and cooperative in NAD Head:  Normocephalic and atraumatic. Eyes:  Sclera clear, no icterus.   Conjunctiva pale Ears:  Normal auditory acuity. Nose:  No deformity, discharge,  or lesions. Mouth:  Poor dentition otherwise no deformity or lesions.  Oropharynx pale and dry Neck:  Supple; no masses or thyromegaly. Lungs:  No respiratory distress Abdomen:  Soft, nontender and nondistended. No masses, hepatosplenomegaly or hernias noted. Normal bowel sounds, without guarding, and without rebound.     Msk:  Symmetrical without gross deformities. Normal posture. Pulses:  Normal pulses noted. Extremities:  Without clubbing or edema. Neurologic:  Alert and  oriented x4;  grossly normal neurologically. Skin:  Intact without significant lesions or rashes. Psych:  Alert and cooperative. Normal mood and affect.   Lab Results: Recent Labs    03/23/23 1300 03/24/23 0426  WBC 9.8  --   HGB 6.5* 7.1*  HCT 19.1* 21.1*  PLT 227  --    BMET Recent Labs    03/23/23 1300 03/24/23 0426  NA 132* 136  K 4.0 3.7  CL 100 103  CO2 25 25  GLUCOSE 99 94  BUN 10 6  CREATININE 0.79 0.78  CALCIUM 8.5* 8.4*    LFT Recent Labs    03/24/23 0426  PROT 5.6*  ALBUMIN 3.3*  AST 51*  ALT 55*  ALKPHOS 46  BILITOT 0.6   PT/INR Recent Labs    03/24/23 0426  LABPROT 13.6  INR 1.0    Studies/Results: No results found.  Impression:  Constipation. Abdominal discomfort. Acute blood loss anemia. NSAID use for dental/mouth pain. Melena.  Plan:   NPO. PPI. IVF Follow CBCs, transfuse as needed. No ASA/NSAIDs. Endoscopy today for further evaluation. Risks (bleeding, infection, bowel perforation that could require surgery, sedation-related changes in cardiopulmonary systems), benefits (identification and possible treatment of source of symptoms, exclusion of certain causes of symptoms), and alternatives (watchful waiting, radiographic imaging studies, empiric medical treatment) of upper endoscopy (EGD) were explained to patient/family in detail and patient wishes to proceed.  [facility] will follow; next step in management pending endoscopy findings.   LOS: 1 day   Jaremy Nosal M  03/24/2023, 10:54 AM  Cell [phone] If no answer or after 5 PM call [phone]

## 2023-03-24 NOTE — Progress Notes (Signed)
Patient very upset this evening.  Stated that the CNA that came in disrespected his mom in the fact that she wouldn't let him get up and go to the bathroom, instead made him sit on side of bed and use the urinal.  Informed patient that there had been a miscommunication.  I informed her that he could go to the bathroom with assist since he had the sedative procedure today but didn't want him taking himself just yet.  Patient is still upset and wants to leave as soon as he can but is willing to stay until MD states he can leave.  Informed to call if writer could get him anything, and apologized again for the miscommunication and the disrespect he felt with situation.

## 2023-03-24 NOTE — Anesthesia Postprocedure Evaluation (Signed)
Anesthesia Post Note  Patient: Ian Arnold  Procedure(s) Performed: ESOPHAGOGASTRODUODENOSCOPY (EGD) BIOPSY     Patient location during evaluation: PACU Anesthesia Type: MAC Level of consciousness: awake and alert Pain management: pain level controlled Vital Signs Assessment: post-procedure vital signs reviewed and stable Respiratory status: spontaneous breathing, nonlabored ventilation, respiratory function stable and patient connected to nasal cannula oxygen Cardiovascular status: stable and blood pressure returned to baseline Postop Assessment: no apparent nausea or vomiting Anesthetic complications: no  No notable events documented.  Last Vitals:  Vitals:   03/24/23 1535 03/24/23 1540  BP:    Pulse: 65 (!) 56  Resp: 18 20  Temp:    SpO2: 100% 100%    Last Pain:  Vitals:   03/24/23 1510  TempSrc:   PainSc: 0-No pain                 Luisalberto Beegle S

## 2023-03-24 NOTE — Consult Note (Signed)
[facility] Consultation Note  Referring Provider: [facility] Primary Care Physician:  System, Provider Not In Primary Gastroenterologist:  Unassigned  Reason for Consultation:  melena, anemia  HPI: Ian Arnold is a 57 y.o. male admitted melena, anemia.  History of constipation with straining over past few days.  During same time frame was taking NSAIDs for mouth/dental pain.  Developed several days of melenic stools.  Some generalized abdominal discomfort.  No prior endoscopy or colonoscopy.  No hematemesis.  Hgb 6.5 on admission, 7.1 after one unit prbcs, receiving second unit now.   Past Medical History:  Diagnosis Date   GERD (gastroesophageal reflux disease)    tums   Hypothyroidism    has never been on medication   Thyroid disease     Past Surgical History:  Procedure Laterality Date   HERNIA REPAIR     INGUINAL HERNIA REPAIR  07/18/2012   Procedure: HERNIA REPAIR INGUINAL ADULT;  Surgeon: Robyne Askew, MD;  Location: [facility] OR;  Service: General;  Laterality: Right;   INSERTION OF MESH  07/18/2012   Procedure: INSERTION OF MESH;  Surgeon: Robyne Askew, MD;  Location: [facility] OR;  Service: General;  Laterality: N/A;   KNEE ARTHROSCOPY     KNEE RECONSTRUCTION     RECONSTRUCTION OF NOSE      Prior to Admission medications   Medication Sig Start Date End Date Taking? Authorizing Provider  docusate sodium (COLACE) 100 MG capsule Take 100 mg by mouth daily as needed for mild constipation or moderate constipation.   Yes [provider]  ibuprofen (ADVIL) 200 MG tablet Take 200 mg by mouth every 6 (six) hours as needed for cramping, moderate pain or mild pain.   Yes [provider]  Melatonin 1 MG TABS Take 1 tablet by mouth at bedtime as needed.   Yes [provider]  amoxicillin-clavulanate (AUGMENTIN) 875-125 MG tablet Take 1 tablet by mouth 2 (two) times daily. One po bid x 7 days Patient not taking: Reported on 03/24/2023 05/12/21    Muthersbaugh, Dahlia Client, PA-C  fluticasone [facility]) 50 MCG/ACT nasal spray Place 2 sprays into both nostrils daily. Patient not taking: Reported on 03/24/2023 05/12/21   Muthersbaugh, Dahlia Client, PA-C  oxyCODONE-acetaminophen (PERCOCET/ROXICET) 5-325 MG tablet Take 1 tablet by mouth every 4 (four) hours as needed for severe pain. Patient not taking: Reported on 03/24/2023 06/07/16   Barrett Henle, PA-C  traMADol (ULTRAM) 50 MG tablet Take 1 tablet (50 mg total) by mouth every 6 (six) hours as needed. Patient not taking: Reported on 03/24/2023 09/22/14   Santiago Glad, PA-C    Current Facility-Administered Medications  Medication Dose Route Frequency Provider Last Rate Last Admin   acetaminophen (TYLENOL) tablet 650 mg  650 mg Oral Q6H PRN Opyd, Lavone Neri, MD   650 mg at 03/23/23 2316   Or   acetaminophen (TYLENOL) suppository 650 mg  650 mg Rectal Q6H PRN Opyd, Lavone Neri, MD       folic acid (FOLVITE) tablet 1 mg  1 mg Oral Daily Opyd, Lavone Neri, MD   1 mg at 03/24/23 1019   lactated ringers infusion   Intravenous Continuous Opyd, Lavone Neri, MD 100 mL/hr at 03/24/23 0411 New Bag at 03/24/23 0411   LORazepam (ATIVAN) tablet 0-4 mg  0-4 mg Oral Q6H Opyd, Lavone Neri, MD   1 mg at 03/24/23 1023   Followed by   Melene Muller ON 03/25/2023] LORazepam (ATIVAN) tablet 0-4 mg  0-4 mg Oral Q12H  Opyd, Lavone Neri, MD       LORazepam (ATIVAN) tablet 1 mg  1 mg Oral Q8H PRN Kathlen Mody, MD       LORazepam (ATIVAN) tablet 1-4 mg  1-4 mg Oral Q1H PRN Opyd, Lavone Neri, MD       melatonin tablet 3 mg  3 mg Oral QHS PRN Opyd, Lavone Neri, MD   3 mg at 03/23/23 2316   multivitamin with minerals tablet 1 tablet  1 tablet Oral Daily Opyd, Lavone Neri, MD   1 tablet at 03/24/23 1019   nicotine (NICODERM CQ - dosed in mg/24 hours) patch 21 mg  21 mg Transdermal Daily Opyd, Lavone Neri, MD   21 mg at 03/24/23 1018   nitroGLYCERIN (NITROSTAT) SL tablet 0.4 mg  0.4 mg Sublingual Q5 min PRN Foust, Katy L, NP       ondansetron  (ZOFRAN) tablet 4 mg  4 mg Oral Q6H PRN Opyd, Lavone Neri, MD       Or   ondansetron (ZOFRAN) injection 4 mg  4 mg Intravenous Q6H PRN Opyd, Lavone Neri, MD       pantoprozole (PROTONIX) 80 mg /NS 100 mL infusion  8 mg/hr Intravenous Continuous Opyd, Lavone Neri, MD 10 mL/hr at 03/24/23 0409 8 mg/hr at 03/24/23 0409   sodium chloride flush (NS) 0.9 % injection 3 mL  3 mL Intravenous Q12H Opyd, Lavone Neri, MD   3 mL at 03/23/23 2300   thiamine (VITAMIN B1) tablet 100 mg  100 mg Oral Daily Opyd, Lavone Neri, MD   100 mg at 03/24/23 1019   Or   thiamine (VITAMIN B1) injection 100 mg  100 mg Intravenous Daily Opyd, Lavone Neri, MD        Allergies as of 03/23/2023 - Review Complete 03/23/2023  Allergen Reaction Noted   Measles virus live vaccine Other (See Comments) 07/07/2012    History reviewed. No pertinent family history.  Social History   Socioeconomic History   Marital status: Married    Spouse name: Not on file   Number of children: Not on file   Years of education: Not on file   Highest education level: Not on file  Occupational History   Not on file  Tobacco Use   Smoking status: Former    Current packs/day: 0.00    Average packs/day: 1.5 packs/day for 14.0 years (21.0 ttl pk-yrs)    Types: Cigarettes    Start date: 08/30/1994    Quit date: 08/31/1994    Years since quitting: 28.5   Smokeless tobacco: Never  Substance and Sexual Activity   Alcohol use: No   Drug use: No   Sexual activity: Not on file  Other Topics Concern   Not on file  Social History Narrative   Not on file   Social Determinants of Health   Financial Resource Strain: Not on file  Food Insecurity: Food Insecurity Present (03/23/2023)   Hunger Vital Sign    Worried About Running Out of Food in the Last Year: Sometimes true    Ran Out of Food in the Last Year: Never true  Transportation Needs: Unmet Transportation Needs (03/23/2023)   PRAPARE - Administrator, Civil Service (Medical): Yes    Lack  of Transportation (Non-Medical): Yes  Physical Activity: Not on file  Stress: Not on file  Social Connections: Not on file  Intimate Partner Violence: Not At Risk (03/23/2023)   Humiliation, Afraid, Rape, and Kick questionnaire    Fear  of Current or Ex-Partner: No    Emotionally Abused: No    Physically Abused: No    Sexually Abused: No    Review of Systems: As per HPI, all others negative  Physical Exam: Vital signs in last 24 hours: Temp:  [98.1 F (36.7 C)-98.9 F (37.2 C)] 98.2 F (36.8 C) (07/24 1045) Pulse Rate:  [61-89] 61 (07/24 1045) Resp:  [10-20] 10 (07/24 1045) BP: (105-133)/(67-89) 129/89 (07/24 1045) SpO2:  [97 %-100 %] 100 % (07/24 1045) Weight:  [80 kg-81.6 kg] 80 kg (07/24 0500) Last BM Date : 03/22/23 General:   Alert,  Well-developed, well-nourished, pleasant and cooperative in NAD Head:  Normocephalic and atraumatic. Eyes:  Sclera clear, no icterus.   Conjunctiva pale Ears:  Normal auditory acuity. Nose:  No deformity, discharge,  or lesions. Mouth:  Poor dentition otherwise no deformity or lesions.  Oropharynx pale and dry Neck:  Supple; no masses or thyromegaly. Lungs:  No respiratory distress Abdomen:  Soft, nontender and nondistended. No masses, hepatosplenomegaly or hernias noted. Normal bowel sounds, without guarding, and without rebound.     Msk:  Symmetrical without gross deformities. Normal posture. Pulses:  Normal pulses noted. Extremities:  Without clubbing or edema. Neurologic:  Alert and  oriented x4;  grossly normal neurologically. Skin:  Intact without significant lesions or rashes. Psych:  Alert and cooperative. Normal mood and affect.   Lab Results: Recent Labs    03/23/23 1300 03/24/23 0426  WBC 9.8  --   HGB 6.5* 7.1*  HCT 19.1* 21.1*  PLT 227  --    BMET Recent Labs    03/23/23 1300 03/24/23 0426  NA 132* 136  K 4.0 3.7  CL 100 103  CO2 25 25  GLUCOSE 99 94  BUN 10 6  CREATININE 0.79 0.78  CALCIUM 8.5* 8.4*    LFT Recent Labs    03/24/23 0426  PROT 5.6*  ALBUMIN 3.3*  AST 51*  ALT 55*  ALKPHOS 46  BILITOT 0.6   PT/INR Recent Labs    03/24/23 0426  LABPROT 13.6  INR 1.0    Studies/Results: No results found.  Impression:  Constipation. Abdominal discomfort. Acute blood loss anemia. NSAID use for dental/mouth pain. Melena.  Plan:   NPO. PPI. IVF Follow CBCs, transfuse as needed. No ASA/NSAIDs. Endoscopy today for further evaluation. Risks (bleeding, infection, bowel perforation that could require surgery, sedation-related changes in cardiopulmonary systems), benefits (identification and possible treatment of source of symptoms, exclusion of certain causes of symptoms), and alternatives (watchful waiting, radiographic imaging studies, empiric medical treatment) of upper endoscopy (EGD) were explained to patient/family in detail and patient wishes to proceed.  [facility] will follow; next step in management pending endoscopy findings.   LOS: 1 day   Bernabe Dorce M  03/24/2023, 10:54 AM  Cell [phone] If no answer or after 5 PM call [phone]

## 2023-03-25 DIAGNOSIS — E440 Moderate protein-calorie malnutrition: Secondary | ICD-10-CM | POA: Insufficient documentation

## 2023-03-25 LAB — SURGICAL PATHOLOGY

## 2023-03-25 LAB — HEMATOCRIT: HCT: 24 % — ABNORMAL LOW (ref 39.0–52.0)

## 2023-03-25 LAB — URINALYSIS, W/ REFLEX TO CULTURE (INFECTION SUSPECTED)
Bacteria, UA: NONE SEEN
Bilirubin Urine: NEGATIVE
Glucose, UA: NEGATIVE mg/dL
Hgb urine dipstick: NEGATIVE
Ketones, ur: NEGATIVE mg/dL
Leukocytes,Ua: NEGATIVE
Nitrite: NEGATIVE
Protein, ur: NEGATIVE mg/dL
Specific Gravity, Urine: 1.01 (ref 1.005–1.030)
pH: 7 (ref 5.0–8.0)

## 2023-03-25 LAB — COMPREHENSIVE METABOLIC PANEL
ALT: 54 U/L — ABNORMAL HIGH (ref 0–44)
AST: 47 U/L — ABNORMAL HIGH (ref 15–41)
Albumin: 3.2 g/dL — ABNORMAL LOW (ref 3.5–5.0)
Alkaline Phosphatase: 48 U/L (ref 38–126)
BUN: 8 mg/dL (ref 6–20)
CO2: 23 mmol/L (ref 22–32)
Calcium: 8.2 mg/dL — ABNORMAL LOW (ref 8.9–10.3)
Chloride: 102 mmol/L (ref 98–111)
GFR, Estimated: >60 mL/min (ref >60)
Glucose, Bld: 119 mg/dL — ABNORMAL HIGH (ref 70–99)
Potassium: 4.1 mmol/L (ref 3.5–5.1)

## 2023-03-25 LAB — TYPE AND SCREEN
Unit division: 0
Unit division: 0

## 2023-03-25 LAB — BPAM RBC
Blood Product Expiration Date: 202408222359
Blood Product Expiration Date: 202408222359
ISSUE DATE / TIME: 202407240816

## 2023-03-25 LAB — HEMOGLOBIN AND HEMATOCRIT, BLOOD
HCT: 25.3 % — ABNORMAL LOW (ref 39.0–52.0)
Hemoglobin: 8.4 g/dL — ABNORMAL LOW (ref 13.0–17.0)

## 2023-03-25 MED ORDER — SENNOSIDES-DOCUSATE SODIUM 8.6-50 MG PO TABS
2.0000 | ORAL_TABLET | Freq: Two times a day (BID) | ORAL | Status: DC
  Administered 2023-03-25 – 2023-03-26 (×3): 2 via ORAL
  Filled 2023-03-25 (×3): qty 2

## 2023-03-25 MED ORDER — POLYETHYLENE GLYCOL 3350 17 G PO PACK
17.0000 g | PACK | Freq: Every day | ORAL | Status: DC
  Administered 2023-03-25 – 2023-03-26 (×2): 17 g via ORAL
  Filled 2023-03-25 (×2): qty 1

## 2023-03-25 NOTE — TOC Progression Note (Signed)
Transition of Care [facility]) - Progression Note    Patient Details  Name: Ian Arnold MRN: [id number] Date of Birth: [birth year]  [facility] CM/SW Contact  Larrie Kass, LCSW Phone Number: 03/25/2023, 9:47 AM  Clinical Narrative:    A hospital follow-up appointment is scheduled at [facility] on 04/14/23, the appointment has been attached to pt's AVS. CSW discussed with pt that he can apply for financial assistance due to being uninsured. TOC to follow.      Barriers to Discharge: Continued Medical Work up  Expected Discharge Plan and Services In-house Referral: Clinical Social Work     Living arrangements for the past 2 months: Single Family Home                                       Social Determinants of Health (SDOH) Interventions SDOH Screenings   Food Insecurity: Food Insecurity Present (03/23/2023)  Housing: Low Risk  (03/23/2023)  Transportation Needs: Unmet Transportation Needs (03/23/2023)  Utilities: Not At Risk (03/23/2023)  Tobacco Use: Medium Risk (03/23/2023)    Readmission Risk Interventions     No data to display

## 2023-03-25 NOTE — Progress Notes (Signed)
[facility]                                                                               Ian Arnold, is a 57 y.o. male, DOB - 1965/12/03, UVO:[id number] Admit date - 03/23/2023    Outpatient Primary MD for the patient is System, Provider Not In  LOS - 2  days    Brief summary    57 y.o. male with medical history significant for thyroid disease, GERD, and alcohol abuse, now presenting with melena and lightheadedness.   GI (Dr. Dulce Sellar) was consulted by the ED physician and the patient was given started on IV Protonix. 2 unit of packed RBC was ordered for transfusion.   Assessment & Plan    Assessment and Plan:   Symptomatic anemia/ acute anemia of blood loss: 2 units of prbc transfusion.  Recheck H&h post transfusion.  Transfuse to keep hemoglobin greater than 7.  Anemia panel ordered.  GI consulted and  underwent EGD showing Gastritis. Biopsied. - Mucosal changes in the duodenum. - Non- bleeding duodenal ulcers with no stigmata of bleeding. Suspect NSAID ulcers. Biopsy results pending.  Continue with Oral PPI.   Alcohol abuse:  On CIWA protocol.    Alcoholic hepatitis:  Mildly elevated liver enzymes.  Repeat liver panel in one week.       Estimated body mass index is 24.62 kg/m as calculated from the following:   Height as of this encounter: 5\' 11"  (1.803 m).   Weight as of this encounter: 80.1 kg.  Code Status: full code.  DVT Prophylaxis:  SCDs Start: 03/23/23 1510   Level of Care: Level of care: Med-Surg Family Communication: None at bedside.  Disposition Plan:     Remains inpatient appropriate:  pending   Procedures:  None.   Consultants:   Gastroenterology.   Antimicrobials:   Anti-infectives (From admission, onward)    None        Medications  Scheduled Meds:  folic acid  1 mg Oral Daily   LORazepam  0-4 mg Oral Q6H   Followed by   LORazepam  0-4 mg Oral Q12H   multivitamin with minerals  1 tablet Oral Daily    nicotine  21 mg Transdermal Daily   pantoprazole  40 mg Oral BID AC   polyethylene glycol  17 g Oral Daily   senna-docusate  2 tablet Oral BID   sodium chloride flush  3 mL Intravenous Q12H   thiamine  100 mg Oral Daily   Or   thiamine  100 mg Intravenous Daily   Continuous Infusions:   PRN Meds:.acetaminophen **OR** acetaminophen, LORazepam, LORazepam, melatonin, nitroGLYCERIN, ondansetron **OR** ondansetron (ZOFRAN) IV    Subjective:   Ian Arnold was seen and examined today.  ANXIOUS.   Objective:   Vitals:   03/24/23 1540 03/24/23 1630 03/24/23 2006 03/25/23 0510  BP: (!) 110/58 128/89 104/67 118/76  Pulse: (!) 56 (!) 50 67 62  Resp: 20 20 14 14   Temp:  97.8 F (36.6 C) 98 F (36.7 C) 98.4 F (36.9 C)  TempSrc:  Oral Oral Oral  SpO2: 100% 99% 100% 99%  Weight:      Height:  Intake/Output Summary (Last 24 hours) at 03/25/2023 1006 Last data filed at 03/25/2023 0919 Gross per 24 hour  Intake 2956.63 ml  Output 3875 ml  Net -918.37 ml   Filed Weights   03/23/23 1126 03/24/23 0500 03/24/23 1421  Weight: 81.6 kg 80 kg 80.1 kg     Exam General exam: Appears calm and comfortable  Respiratory system: Clear to auscultation. Respiratory effort normal. Cardiovascular system: S1 & S2 heard, RRR. No JVD,  Gastrointestinal system: Abdomen is nondistended, soft and nontender.  Central nervous system: Alert and oriented. No focal neurological deficits. Extremities: Symmetric 5 x 5 power. Skin: No rashes, lesions or ulcers Psychiatry: Mood & affect appropriate.      Data Reviewed:  I have personally reviewed following labs and imaging studies   CBC Lab Results  Component Value Date   WBC 9.8 03/23/2023   RBC 2.39 (L) 03/24/2023   HGB 8.4 (L) 03/25/2023   HCT 25.3 (L) 03/25/2023   MCV 103.2 (H) 03/23/2023   MCH 35.1 (H) 03/23/2023   PLT 227 03/23/2023   MCHC 34.0 03/23/2023   RDW 15.0 03/23/2023     Last metabolic panel Lab Results  Component  Value Date   NA 134 (L) 03/25/2023   K 4.1 03/25/2023   CL 102 03/25/2023   CO2 23 03/25/2023   BUN 8 03/25/2023   CREATININE 0.75 03/25/2023   GLUCOSE 119 (H) 03/25/2023   GFRNONAA >60 03/25/2023   GFRAA >90 07/14/2012   CALCIUM 8.2 (L) 03/25/2023   PROT 5.6 (L) 03/25/2023   ALBUMIN 3.2 (L) 03/25/2023   BILITOT 0.3 03/25/2023   ALKPHOS 48 03/25/2023   AST 47 (H) 03/25/2023   ALT 54 (H) 03/25/2023   ANIONGAP 9 03/25/2023    CBG (last 3)  No results for input(s): "GLUCAP" in the last 72 hours.    Coagulation Profile: Recent Labs  Lab 03/24/23 0426  INR 1.0     Radiology Studies: No results found.     Kathlen Mody M.D. [facility] 03/25/2023, 10:06 AM  Available via Epic secure chat 7am-7pm After 7 pm, please refer to night coverage provider listed on amion.

## 2023-03-25 NOTE — Progress Notes (Signed)
Subjective: No melena or hematemesis. Dyspepsia post-prandially.  Objective: Vital signs in last 24 hours: Temp:  [97.6 F (36.4 C)-98.4 F (36.9 C)] 98.4 F (36.9 C) (07/25 0510) Pulse Rate:  [50-109] 62 (07/25 0510) Resp:  [10-20] 14 (07/25 0510) BP: (67-135)/(38-89) 118/76 (07/25 0510) SpO2:  [99 %-100 %] 99 % (07/25 0510) Weight:  [80.1 kg] 80.1 kg (07/24 1421) Weight change: -1.588 kg Last BM Date : 03/24/23  PE: GEN:  NAD NEURO:  No encephalopathy  Lab Results: CBC    Component Value Date/Time   WBC 9.8 03/23/2023 1300   RBC 2.39 (L) 03/24/2023 1340   RBC 1.85 (L) 03/23/2023 1300   HGB 8.4 (L) 03/25/2023 0857   HCT 25.3 (L) 03/25/2023 0857   PLT 227 03/23/2023 1300   MCV 103.2 (H) 03/23/2023 1300   MCH 35.1 (H) 03/23/2023 1300   MCHC 34.0 03/23/2023 1300   RDW 15.0 03/23/2023 1300  CMP     Component Value Date/Time   NA 134 (L) 03/25/2023 0055   K 4.1 03/25/2023 0055   CL 102 03/25/2023 0055   CO2 23 03/25/2023 0055   GLUCOSE 119 (H) 03/25/2023 0055   BUN 8 03/25/2023 0055   CREATININE 0.75 03/25/2023 0055   CALCIUM 8.2 (L) 03/25/2023 0055   PROT 5.6 (L) 03/25/2023 0055   ALBUMIN 3.2 (L) 03/25/2023 0055   AST 47 (H) 03/25/2023 0055   ALT 54 (H) 03/25/2023 0055   ALKPHOS 48 03/25/2023 0055   BILITOT 0.3 03/25/2023 0055   GFRNONAA >60 03/25/2023 0055   Assessment:   Melena, resolved. Acute blood loss anemia. NSAID use. Gastritis, biopsied.  Duodenal ulcers, likely NSAID.  Plan:   Pantoprazole 40 mg po bid x 6 weeks then 40 mg po every day thereafter indefinitely. No ASA/NSAIDs. Awaiting gastric biopsies. Advance diet as tolerated. Follow CBC. Ok to d/c later today versus tomorrow from GI perspective. He will need outpatient follow-up with [place], follow-up gastric biopsies and cbc, and will ultimately need screening colonoscopy. [facility] will sign-off; please call with questions; thank you for the consultation.   Freddy Jaksch 03/25/2023,  10:43 AM   Cell [phone] If no answer or after 5 PM call [phone]

## 2023-03-25 NOTE — Progress Notes (Signed)
Initial Nutrition Assessment  DOCUMENTATION CODES:   Non-severe (moderate) malnutrition in context of chronic illness  INTERVENTION:  - Soft diet per MD.  - Ensure Plus High Protein po once daily, provides 350 kcal and 20 grams of protein. - Multivitamin with minerals daily, folic acid, and thiamine for alcohol abuse.  - Monitor weight trends.  NUTRITION DIAGNOSIS:   Moderate Malnutrition related to chronic illness as evidenced by mild fat depletion, mild muscle depletion.  GOAL:   Patient will meet greater than or equal to 90% of their needs  MONITOR:   PO intake, Supplement acceptance, Weight trends  REASON FOR ASSESSMENT:   Malnutrition Screening Tool    ASSESSMENT:   57 y.o. male with PMH significant for thyroid disease, GERD, and alcohol abuse who presented with melena and lightheadedness. Admitted for symptomatic anemia/acute anemia of blood loss.  7/23 Admit 7/24 Upper GI endoscopy: found to have gastritis, mucosal changes in duodenum, and non-bleeding duodenal ulcers  Patient endorses a recent UBW of 186# but shares he weighed as much as 246# 3 years ago and has been steadily losing weight over that time. Attributes weight loss to working outside a lot and not eating very much. Per EMR, no weight history within the past year to assess recent changes. Of note, patient weighed at 188# in 2022.  Patient notes that over the past few weeks his appetite has been decreased as has his desire to eat. Notes he would eat a lot less at meals. Also endorses drinking more alcohol and states his car broke down so he has been walking more. Patient reports he went 10 days without a bowel movement and this also made it difficult to eat.   Thankfully, had a bowel movement 2 days ago and has since felt better. Reports appetite has improved and he endorses having had 100% of dinner last night and 100% of breakfast this morning. Already planning what he will have for lunch. He is agreeable  to try nutrition supplements.   Medications reviewed and include: 1mg  folic acid, 100mg  thiamine, MVI, Miralax, Senokot  Labs reviewed:  Na 134   NUTRITION - FOCUSED PHYSICAL EXAM:  Flowsheet Row Most Recent Value  Orbital Region Mild depletion  Upper Arm Region Moderate depletion  Thoracic and Lumbar Region Mild depletion  Buccal Region No depletion  Temple Region Mild depletion  Clavicle Bone Region Mild depletion  Clavicle and Acromion Bone Region No depletion  Scapular Bone Region Unable to assess  Dorsal Hand No depletion  Patellar Region Mild depletion  Anterior Thigh Region Mild depletion  Posterior Calf Region Moderate depletion  Edema (RD Assessment) None  Hair Reviewed  Eyes Reviewed  Mouth Reviewed  Skin Reviewed  Nails Reviewed       Diet Order:   Diet Order             DIET SOFT Fluid consistency: Thin  Diet effective now                   EDUCATION NEEDS:  Education needs have been addressed  Skin:  Skin Assessment: Reviewed RN Assessment  Last BM:  7/24  Height:  Ht Readings from Last 1 Encounters:  03/24/23 5\' 11"  (1.803 m)   Weight:  Wt Readings from Last 1 Encounters:  03/24/23 80.1 kg    BMI:  Body mass index is 24.62 kg/m.  Estimated Nutritional Needs:  Kcal:  2000-2250 kcals Protein:  90-105 grams Fluid:  >/= 2L    Aleiah Mohammed Maisie Fus  RD, LDN For contact information, refer to [facility].

## 2023-03-26 ENCOUNTER — Other Ambulatory Visit (HOSPITAL_COMMUNITY): Payer: Self-pay

## 2023-03-26 ENCOUNTER — Encounter (HOSPITAL_COMMUNITY): Payer: Self-pay | Admitting: Gastroenterology

## 2023-03-26 MED ORDER — NICOTINE 21 MG/24HR TD PT24
21.0000 mg | MEDICATED_PATCH | Freq: Every day | TRANSDERMAL | 0 refills | Status: AC
  Filled 2023-03-26: qty 28, 28d supply, fill #0

## 2023-03-26 MED ORDER — VITAMIN B-1 100 MG PO TABS
100.0000 mg | ORAL_TABLET | Freq: Every day | ORAL | 0 refills | Status: AC
  Filled 2023-03-26: qty 90, 90d supply, fill #0

## 2023-03-26 MED ORDER — LORAZEPAM 1 MG PO TABS
1.0000 mg | ORAL_TABLET | Freq: Three times a day (TID) | ORAL | 0 refills | Status: AC | PRN
  Filled 2023-03-26: qty 30, 10d supply, fill #0

## 2023-03-26 MED ORDER — FOLIC ACID 1 MG PO TABS
1.0000 mg | ORAL_TABLET | Freq: Every day | ORAL | 0 refills | Status: AC
  Filled 2023-03-26: qty 90, 90d supply, fill #0

## 2023-03-26 MED ORDER — ADULT MULTIVITAMIN W/MINERALS CH
1.0000 | ORAL_TABLET | Freq: Every day | ORAL | 0 refills | Status: AC
  Filled 2023-03-26: qty 90, 90d supply, fill #0

## 2023-03-26 MED ORDER — PANTOPRAZOLE SODIUM 40 MG PO TBEC
40.0000 mg | DELAYED_RELEASE_TABLET | Freq: Two times a day (BID) | ORAL | 2 refills | Status: AC
  Filled 2023-03-26: qty 120, 60d supply, fill #0
  Filled 2023-05-20 (×2): qty 120, 60d supply, fill #1

## 2023-03-26 NOTE — Progress Notes (Signed)
Discharge instructions AVS reviewed w/ patient- teach back method used- [facility] outpatient pharmacy to deliver  new meds, Pt given 2 bus passes by this RN - obtaine from patient's  primary RN  PIV's removed as documented. PT  eating breakfast nas will d/c once meds are received from pharmacy.

## 2023-03-26 NOTE — TOC Transition Note (Signed)
Transition of Care [facility]) - CM/SW Discharge Note   Patient Details  Name: Ian Arnold MRN: [id number] Date of Birth: 1966/02/28  Transition of Care [facility]) CM/SW Contact:  Larrie Kass, LCSW Phone Number: 03/26/2023, 8:49 AM   Clinical Narrative:    Received consult for medication assistance, CSW spoke with [facility] pharmacy to request meds to bed. Pt's meds will be delivered to his room prior to d/c. CSW provided pt's bedside RN with two bus passes for pt's d/c. No further TOC needs, TOC sign-off  .     Final next level of care: Home/Self Care Barriers to Discharge: No Barriers Identified   Patient Goals and CMS Choice      Discharge Placement                      Patient and family notified of of transfer: 03/26/23  Discharge Plan and Services Additional resources added to the After Visit Summary for   In-house Referral: Clinical Social Work                                   Social Determinants of Health (SDOH) Interventions SDOH Screenings   Food Insecurity: Food Insecurity Present (03/23/2023)  Housing: Low Risk  (03/23/2023)  Transportation Needs: Unmet Transportation Needs (03/23/2023)  Utilities: Not At Risk (03/23/2023)  Tobacco Use: Medium Risk (03/23/2023)     Readmission Risk Interventions     No data to display

## 2023-03-26 NOTE — Progress Notes (Signed)
Medications delivered from [facility]  outpatient pharmacy, placed in pt's backpack front pocket per pt request

## 2023-03-26 NOTE — Progress Notes (Signed)
Patient being walked out to front with CNA at this time.  All belongings taken with patient.  No s/s of distress noted denies pain.

## 2023-03-26 NOTE — Plan of Care (Signed)

## 2023-03-26 NOTE — Plan of Care (Signed)
  Problem: Health Behavior/Discharge Planning: Goal: Ability to manage health-related needs will improve Outcome: Progressing   Problem: Clinical Measurements: Goal: Ability to maintain clinical measurements within normal limits will improve Outcome: Progressing Goal: Will remain free from infection Outcome: Progressing   

## 2023-03-29 NOTE — Discharge Summary (Signed)
Physician Discharge Summary   Patient: Ian Arnold MRN: [id number] DOB: 06-29-1966  Admit date:     03/23/2023  Discharge date: 03/26/2023  Discharge Physician: Kathlen Mody   PCP: System, Provider Not In   Recommendations at discharge:  Please follow up with GI as recommended  Please follow up with PCP in one week.  Please follow up the liver function tests in 1 to 2 weeks.   Discharge Diagnoses: Principal Problem:   Upper GI bleed Active Problems:   Acute blood loss anemia   Alcohol abuse   Malnutrition of moderate degree  Resolved Problems:   * No resolved hospital problems. *  Hospital Course:  57 y.o. male with medical history significant for thyroid disease, GERD, and alcohol abuse, now presenting with melena and lightheadedness.    GI (Dr. Dulce Sellar) was consulted by the ED physician and the patient was given started on IV Protonix. 2 unit of packed RBC was ordered for transfusion. Assessment and Plan:  Symptomatic anemia/ acute anemia of blood loss: 2 units of prbc transfusion.  Post transfusion H&h stable around 8.4/25.3.  Transfuse to keep hemoglobin greater than 7.  Anemia panel ordered.  GI consulted and  underwent EGD showing Gastritis. Biopsied. - Mucosal changes in the duodenum. - Non- bleeding duodenal ulcers with no stigmata of bleeding. Suspect NSAID ulcers. Biopsy results pending.  Continue with Oral PPI.    Alcohol abuse:  No signs of withdrawal.      Alcoholic hepatitis:  Mildly elevated liver enzymes.  Repeat liver panel in one week.            Estimated body mass index is 24.62 kg/m as calculated from the following:   Height as of this encounter: 5\' 11"  (1.803 m).   Weight as of this encounter: 80.1 kg.    Consultants: GI  Procedures performed: EGD  Disposition: Home Diet recommendation:  Discharge Diet Orders (From admission, onward)     Start     Ordered   03/26/23 0000  Diet - low sodium heart healthy        03/26/23 0827            Regular diet DISCHARGE MEDICATION: Allergies as of 03/26/2023       Reactions   Measles Virus Live Vaccine Other (See Comments)   As a child        Medication List     STOP taking these medications    amoxicillin-clavulanate 875-125 MG tablet Commonly known as: Augmentin   fluticasone 50 MCG/ACT nasal spray Commonly known as: FLONASE   ibuprofen 200 MG tablet Commonly known as: ADVIL   oxyCODONE-acetaminophen 5-325 MG tablet Commonly known as: PERCOCET/ROXICET   traMADol 50 MG tablet Commonly known as: ULTRAM       TAKE these medications    CertaVite/Antioxidants Tabs Take 1 tablet by mouth daily.   docusate sodium 100 MG capsule Commonly known as: COLACE Take 100 mg by mouth daily as needed for mild constipation or moderate constipation.   folic acid 1 MG tablet Commonly known as: FOLVITE Take 1 tablet (1 mg) by mouth daily.   LORazepam 1 MG tablet Commonly known as: ATIVAN Take 1 tablet (1 mg) by mouth every 8 hours as needed for anxiety.   melatonin 1 MG Tabs tablet Take 1 tablet by mouth at bedtime as needed.   nicotine 21 mg/24hr patch Commonly known as: NICODERM CQ - dosed in mg/24 hours Place 1 patch (21 mg) onto the skin daily.  pantoprazole 40 MG tablet Commonly known as: PROTONIX Take 1 tablet (40 mg) by mouth 2 times daily before a meal.   thiamine 100 MG tablet Commonly known as: VITAMIN B1 Take 1 tablet (100 mg) by mouth daily.        Follow-up Information     [facility]. Go on 04/14/2023.   Why: Follow up appointment is on 04/14/23, at 10:15am. please bring 25$ copay. Clinic will help with applying for medical assitinace. Contact information: [address] [place] [address] [phone]               Discharge Exam: Filed Weights   03/24/23 0500 03/24/23 1421 03/26/23 0422  Weight: 80 kg 80.1 kg 81.6 kg   General exam: Appears calm and comfortable  Respiratory  system: Clear to auscultation. Respiratory effort normal. Cardiovascular system: S1 & S2 heard, RRR. No JVD, murmurs, rubs, gallops or clicks. No pedal edema. Gastrointestinal system: Abdomen is nondistended, soft and nontender. Central nervous system: Alert and oriented. No focal neurological deficits. Extremities: Symmetric 5 x 5 power. Skin: No rashes, lesions or ulcers Psychiatry: Judgement and insight appear normal. Mood & affect appropriate.    Condition at discharge: fair  The results of significant diagnostics from this hospitalization (including imaging, microbiology, ancillary and laboratory) are listed below for reference.   Imaging Studies: No results found.  Microbiology: Results for orders placed or performed during the hospital encounter of 12/21/21  Resp Panel by RT-PCR (Flu A&B, Covid) Nasopharyngeal Swab     Status: None   Collection Time: 12/21/21  3:36 PM   Specimen: Nasopharyngeal Swab; Nasopharyngeal(NP) swabs in vial transport medium  Result Value Ref Range Status   SARS Coronavirus 2 by RT PCR NEGATIVE NEGATIVE Final    Comment: (NOTE) SARS-CoV-2 target nucleic acids are NOT DETECTED.  The SARS-CoV-2 RNA is generally detectable in upper respiratory specimens during the acute phase of infection. The lowest concentration of SARS-CoV-2 viral copies this assay can detect is 138 copies/mL. A negative result does not preclude SARS-Cov-2 infection and should not be used as the sole basis for treatment or other patient management decisions. A negative result may occur with  improper specimen collection/handling, submission of specimen other than nasopharyngeal swab, presence of viral mutation(s) within the areas targeted by this assay, and inadequate number of viral copies(<138 copies/mL). A negative result must be combined with clinical observations, patient history, and epidemiological information. The expected result is Negative.  Fact Sheet for Patients:   BloggerCourse.com  Fact Sheet for Healthcare Providers:  SeriousBroker.it  This test is no t yet approved or cleared by the [place] FDA and  has been authorized for detection and/or diagnosis of SARS-CoV-2 by FDA under an Emergency Use Authorization (EUA). This EUA will remain  in effect (meaning this test can be used) for the duration of the COVID-19 declaration under Section 564(b)(1) of the Act, 21 U.S.C.section 360bbb-3(b)(1), unless the authorization is terminated  or revoked sooner.       Influenza A by PCR NEGATIVE NEGATIVE Final   Influenza B by PCR NEGATIVE NEGATIVE Final    Comment: (NOTE) The Xpert Xpress SARS-CoV-2/FLU/RSV plus assay is intended as an aid in the diagnosis of influenza from Nasopharyngeal swab specimens and should not be used as a sole basis for treatment. Nasal washings and aspirates are unacceptable for Xpert Xpress SARS-CoV-2/FLU/RSV testing.  Fact Sheet for Patients: BloggerCourse.com  Fact Sheet for Healthcare Providers: SeriousBroker.it  This test is not  yet approved or cleared by the [place] and has been authorized for detection and/or diagnosis of SARS-CoV-2 by FDA under an Emergency Use Authorization (EUA). This EUA will remain in effect (meaning this test can be used) for the duration of the COVID-19 declaration under Section 564(b)(1) of the Act, 21 U.S.C. section 360bbb-3(b)(1), unless the authorization is terminated or revoked.  Performed at [facility], [address] [place], [place] [address]     Labs: CBC: Recent Labs  Lab 03/23/23 1300 03/24/23 0426 03/24/23 1340 03/24/23 1608 03/25/23 0055 03/25/23 0857  WBC 9.8  --   --   --   --   --   HGB 6.5* 7.1* 8.0*  --   --  8.4*  HCT 19.1* 21.1* 24.1* 26.0* 24.0* 25.3*  MCV 103.2*  --   --   --   --   --   PLT 227  --   --   --   --   --     Basic Metabolic Panel: Recent Labs  Lab 03/23/23 1300 03/24/23 0426 03/25/23 0055 03/26/23 0411  NA 132* 136 134* 136  K 4.0 3.7 4.1 4.3  CL 100 103 102 103  CO2 25 25 23 25   GLUCOSE 99 94 119* 102*  BUN 10 6 8 12   CREATININE 0.79 0.78 0.75 0.66  CALCIUM 8.5* 8.4* 8.2* 8.6*  MG  --  2.2  --   --    Liver Function Tests: Recent Labs  Lab 03/23/23 1300 03/24/23 0426 03/25/23 0055 03/26/23 0411  AST 53* 51* 47* 29  ALT 53* 55* 54* 44  ALKPHOS 48 46 48 53  BILITOT 0.5 0.6 0.3 0.3  PROT 5.9* 5.6* 5.6* 5.9*  ALBUMIN 3.6 3.3* 3.2* 3.4*   CBG: No results for input(s): "GLUCAP" in the last 168 hours.  Discharge time spent: 40 min  Signed: Kathlen Mody, MD [facility]

## 2023-04-14 ENCOUNTER — Ambulatory Visit (INDEPENDENT_AMBULATORY_CARE_PROVIDER_SITE_OTHER): Payer: Medicaid Other | Admitting: Student

## 2023-04-14 ENCOUNTER — Encounter: Payer: Self-pay | Admitting: Student

## 2023-04-14 ENCOUNTER — Other Ambulatory Visit: Payer: Self-pay

## 2023-04-14 VITALS — BP 128/73 | HR 70 | Temp 98.4°F | Ht 71.5 in | Wt 183.5 lb

## 2023-04-14 DIAGNOSIS — F419 Anxiety disorder, unspecified: Secondary | ICD-10-CM | POA: Diagnosis present

## 2023-04-14 NOTE — Progress Notes (Signed)
Established Patient Office Visit  Subjective   Patient ID: Ian Arnold, male    DOB: 03-Sep-1965  Age: 57 y.o. MRN: [id number]  Chief Complaint  Patient presents with   Transitions Of Care    [facility]     HPI  Ian Arnold is a 57 y.o. who is coming for a hospital follow up visit. He was referred by [facility] to establish care in our practice, having no prior PCP. Ian Arnold was hospitalized on 03/23/2023-03/26/2023 at [facility]. He presented for evaluation for melena and was found to have gastritis on EGD for which protonix regimen was initiated. During the hospitalization he was noted to have acute blood loss anemia with lowest hemoglobin of 6.5 treated with 2 units of pRBCs. His hemoglobin rose to 8.0 and was 8.4 at the day of discharge. Etiology of gastritis was suspected to be related to NSAID use. Biopsy results were pending at the time of discharge. Patient reports that he got a CBC rechecked with a GI practice 2 days ago and that his hemoglobin was ~10. Unfortunately I am not able to see on Epic or care everywhere that any lab work was done. I suspect it was with a practice that uses a different EMR. His concern is that he continues to feel fatigued and, understanding that a normal Hgb is ~14, he is frustrated with his inability to donate blood. At today's visit we had planned to check follow-up labs at the suggestion of the discharging hospitalist.   Since discharge, he has been adherent to his PPI and all his medications prescribed. He is avoiding NSAIDs and knew that these could exacerbate his bleeding. He was requesting a refill for his lorazepam as his anxiety has been poorly controlled in the setting of his recent hospitalization. We did not have the opportunity to discuss this further.  Unfortunately the encounter did not go smoothly due to a misunderstanding on my part of his test results. We looked up the biopsy results together and clarified the presence of gastritis without signs of  cancer. Despite this reassurance, Ian Arnold was understandably frustrated. I expressed my sincere apologies for my misunderstanding. Ian Arnold shared with me that he was applying for medicaid with assistance from his relative. I shared with him that we have access to other resources for financial support (such as orange card and [employer] assistance) and that I could provide him with the paperwork to help in the meantime. Despite my intention to obtain lab work today, Ian Arnold thought no labs were going to be ordered due to lack of insurance, which was not the case. I discussed labs with him simply to make sure he consented to a potential charge without insurance coverage.    The visit did not proceed in a satisfactory way. No exam was accomplished before he made the decision to leave in frustration. No refills were completed due to inability to further assess. He terminated the visit in an agitated manner.  The only objective information we obtained today were vitals below, and the general observation that he was anxious, frustrated, and irritable.   BP 128/73 (BP Location: Left Arm, Cuff Size: Large)   Pulse 70   Temp 98.4 F (36.9 C) (Oral)   Ht 5' 11.5" (1.816 m)   Wt 183 lb 8 oz (83.2 kg)   SpO2 99%   BMI 25.24 kg/m   I have doubts that Ian Arnold will return to our clinic, through I hope he would  ultimately establish care here. He does have access to emergency resources should they be needed. Today's visit was discussed at length with my attending, Dr. Charissa Bash.   Manuela Neptune, MD

## 2023-04-27 ENCOUNTER — Other Ambulatory Visit: Payer: Self-pay

## 2023-04-27 ENCOUNTER — Encounter (HOSPITAL_COMMUNITY): Payer: Self-pay

## 2023-04-27 ENCOUNTER — Emergency Department (HOSPITAL_COMMUNITY)
Admission: EM | Admit: 2023-04-27 | Discharge: 2023-04-27 | Disposition: A | Discharge status: Home / Self Care | Payer: Medicaid Other | Attending: Emergency Medicine | Admitting: Emergency Medicine

## 2023-04-27 DIAGNOSIS — R040 Epistaxis: Secondary | ICD-10-CM | POA: Diagnosis present

## 2023-04-27 LAB — CBC
HCT: 35.4 % — ABNORMAL LOW (ref 39.0–52.0)
Hemoglobin: 12 g/dL — ABNORMAL LOW (ref 13.0–17.0)
MCH: 30.8 pg (ref 26.0–34.0)
MCHC: 33.9 g/dL (ref 30.0–36.0)
MCV: 90.8 fL (ref 80.0–100.0)
Platelets: 378 10*3/uL (ref 150–400)
RBC: 3.9 MIL/uL — ABNORMAL LOW (ref 4.22–5.81)
RDW: 15.5 % (ref 11.5–15.5)
WBC: 9.1 10*3/uL (ref 4.0–10.5)
nRBC: 0 % (ref 0.0–0.2)

## 2023-04-27 NOTE — ED Provider Notes (Signed)
[place] EMERGENCY DEPARTMENT AT [facility] Provider Note   CSN: [id number] Arrival date & time: 04/27/23  1734     History  Chief Complaint  Patient presents with   Epistaxis    Ian Arnold is a 57 y.o. male history of thyroid disease, and GERD, who presents to the emergency department complaining of a nosebleed.  Patient states he started bleeding out of his left nare about 2 hours prior to ER arrival.  He states that he was able to stop the bleeding, but it did take a while.  He is not on blood thinners.  He was admitted about a month ago for a GI bleed.  Prior to that admission he did have a severe nosebleed.  He comes today with concern that this could have been a precipitating event for another bleed.  He has been taking all his medications as previously prescribed including Protonix.  He started having some abdominal discomfort upon presenting to the ER, but states that he had not had this before.  He is having normal stools, no blood or tar appearing stool.   Epistaxis      Home Medications Prior to Admission medications   Medication Sig Start Date End Date Taking? Authorizing Provider  docusate sodium (COLACE) 100 MG capsule Take 100 mg by mouth daily as needed for mild constipation or moderate constipation.    [provider]  folic acid (FOLVITE) 1 MG tablet Take 1 tablet (1 mg) by mouth daily. 03/26/23   Kathlen Mody, MD  LORazepam (ATIVAN) 1 MG tablet Take 1 tablet (1 mg) by mouth every 8 hours as needed for anxiety. 03/26/23   Kathlen Mody, MD  Melatonin 1 MG TABS Take 1 tablet by mouth at bedtime as needed.    [provider]  Multiple Vitamin (MULTIVITAMIN WITH MINERALS) TABS tablet Take 1 tablet by mouth daily. 03/26/23   Kathlen Mody, MD  nicotine (NICODERM CQ - DOSED IN MG/24 HOURS) 21 mg/24hr patch Place 1 patch (21 mg) onto the skin daily. 03/26/23   Kathlen Mody, MD  pantoprazole (PROTONIX) 40 MG tablet Take 1 tablet (40 mg) by  mouth 2 times daily before a meal. 03/26/23   Kathlen Mody, MD  thiamine (VITAMIN B-1) 100 MG tablet Take 1 tablet (100 mg) by mouth daily. 03/26/23   Kathlen Mody, MD      Allergies    Measles virus live vaccine    Review of Systems   Review of Systems  HENT:  Positive for nosebleeds.   All other systems reviewed and are negative.   Physical Exam Updated Vital Signs BP 126/89   Pulse 85   Temp 98.6 F (37 C) (Oral)   Resp 18   Ht 5\' 11"  (1.803 m)   Wt 83 kg   SpO2 99%   BMI 25.52 kg/m  Physical Exam Vitals and nursing note reviewed.  Constitutional:      Appearance: Normal appearance.  HENT:     Head: Normocephalic and atraumatic.     Nose:     Right Nostril: No epistaxis.     Left Nostril: Epistaxis present.     Comments: Dried blood in the left nare, possible small bleeding lesion, with very mild oozing of blood Eyes:     Conjunctiva/sclera: Conjunctivae normal.  Pulmonary:     Effort: Pulmonary effort is normal. No respiratory distress.  Skin:    General: Skin is warm and dry.  Neurological:     Mental Status:  He is alert.  Psychiatric:        Mood and Affect: Mood normal.        Behavior: Behavior normal.     ED Results / Procedures / Treatments   Labs (all labs ordered are listed, but only abnormal results are displayed) Labs Reviewed  CBC - Abnormal; Notable for the following components:      Result Value   RBC 3.90 (*)    Hemoglobin 12.0 (*)    HCT 35.4 (*)    All other components within normal limits    EKG None  Radiology No results found.  Procedures Procedures    Medications Ordered in ED Medications - No data to display  ED Course/ Medical Decision Making/ A&P                                 Medical Decision Making Amount and/or Complexity of Data Reviewed Labs: ordered.   This patient is a 57 y.o. male  who presents to the ED for concern of nosebleed.   Differential diagnoses prior to evaluation: The emergent  differential diagnosis includes, but is not limited to,  Facial or nasal trauma, coagulopathy, nasal tumor, AVM, dried mucus membranes. This is not an exhaustive differential.   Past Medical History / Co-morbidities / Social History: thyroid disease, GERD  Additional history: Chart reviewed. Pertinent results include: Reviewed ER and admission notes from hospitalization last month.  Most recent hemoglobin 10.3 on 8/8.  Hemoglobin was 6.5 on 7/23 leading to admission. Follows with [facility].   Physical Exam: Physical exam performed. The pertinent findings include: Dried blood in the left nare, and possible very small bleeding lesion, however no active single of blood.  Lab Tests/Imaging studies: I personally interpreted labs/imaging and the pertinent results include: Hemoglobin 12.0.   Disposition: After consideration of the diagnostic results and the patients response to treatment, I feel that emergency department workup does not suggest an emergent condition requiring admission or immediate intervention beyond what has been performed at this time. The plan is: Discharge to home with symptomatic management of HEENT care after epistaxis.  Recommended the patient follow-up with the ENT for recurrent nosebleeds.  Hemoglobin is reassuring, I believe it is returning to baseline after previous anemia.  Patient was nervous about his symptoms since his last nosebleed was prior to GI bleed.  However he has had normal stools and has a benign abdominal exam, so I recommended that he evaluate his stools closely and follow-up with his gastroenterologist as scheduled.   The patient is safe for discharge and has been instructed to return immediately for worsening symptoms, change in symptoms or any other concerns.  Final Clinical Impression(s) / ED Diagnoses Final diagnoses:  Left-sided epistaxis    Rx / DC Orders ED Discharge Orders     None      Portions of this report may have been transcribed using  voice recognition software. Every effort was made to ensure accuracy; however, inadvertent computerized transcription errors may be present.    Jeanella Flattery 04/27/23 2049    Tegeler, Canary Brim, MD 04/27/23 [phone]

## 2023-04-27 NOTE — Discharge Instructions (Addendum)
You were seen in the ER for a nosebleed.  As we discussed, your hemoglobin level was 12.0 today. This is very improved compared to prior.   Continue taking the medications you were prescribed to help with your stomach and improve your levels further.  If you have a nosebleed in the future, I recommend keeping her head forward and pinching the area.  You can use the Afrin nose spray.  If you are going to do this I recommend trying to blow out any clots that you have, and spray 1 spray up the affected nostril.  Continue to pinch her nose, and reevaluate after about 5 minutes.  You can try an additional spray.  If this does not resolve your bleeding, I recommend presenting to the ER for evaluation.  Continue to monitor how you're doing and return to the ER for new or worsening symptoms.

## 2023-04-27 NOTE — ED Triage Notes (Signed)
Pt reports nose bleed about 2 hours PTA. States was really bad. Bleeding stopped at this time. Patient not on blood thinners. States he was admittred about 1 month go for blood transfusion. States prior to that admission, he was having nose bleeds.

## 2023-04-29 NOTE — Progress Notes (Signed)
Internal Medicine Clinic Attending  Mr. Kazmer visit was discussed in detail with Dr. Hassan Rowan.  We regret that the visit did not go as planned and hope that he will return to establish care here.

## 2023-05-19 ENCOUNTER — Emergency Department (HOSPITAL_COMMUNITY): Payer: Medicaid Other

## 2023-05-19 ENCOUNTER — Other Ambulatory Visit: Payer: Self-pay

## 2023-05-19 ENCOUNTER — Emergency Department (HOSPITAL_COMMUNITY)
Admission: EM | Admit: 2023-05-19 | Discharge: 2023-05-19 | Disposition: A | Discharge status: Home / Self Care | Payer: Medicaid Other | Attending: Emergency Medicine | Admitting: Emergency Medicine

## 2023-05-19 ENCOUNTER — Encounter (HOSPITAL_COMMUNITY): Payer: Self-pay

## 2023-05-19 DIAGNOSIS — I959 Hypotension, unspecified: Secondary | ICD-10-CM | POA: Diagnosis not present

## 2023-05-19 DIAGNOSIS — R55 Syncope and collapse: Secondary | ICD-10-CM | POA: Diagnosis not present

## 2023-05-19 DIAGNOSIS — R000 Tachycardia, unspecified: Secondary | ICD-10-CM | POA: Diagnosis not present

## 2023-05-19 DIAGNOSIS — E860 Dehydration: Secondary | ICD-10-CM | POA: Insufficient documentation

## 2023-05-19 DIAGNOSIS — R42 Dizziness and giddiness: Secondary | ICD-10-CM | POA: Diagnosis present

## 2023-05-19 LAB — ETHANOL: Alcohol, Ethyl (B): <10 mg/dL (ref <10)

## 2023-05-19 LAB — URINALYSIS, W/ REFLEX TO CULTURE (INFECTION SUSPECTED)
Bacteria, UA: NONE SEEN
Bilirubin Urine: NEGATIVE
Glucose, UA: NEGATIVE mg/dL
Hgb urine dipstick: NEGATIVE
Ketones, ur: NEGATIVE mg/dL
Leukocytes,Ua: NEGATIVE
Nitrite: NEGATIVE
Protein, ur: 30 mg/dL — AB
Specific Gravity, Urine: 1.015 (ref 1.005–1.030)
pH: 6 (ref 5.0–8.0)

## 2023-05-19 LAB — TYPE AND SCREEN
ABO/RH(D): O POS
Antibody Screen: NEGATIVE

## 2023-05-19 LAB — RAPID URINE DRUG SCREEN, HOSP PERFORMED
Amphetamines: NOT DETECTED
Barbiturates: NOT DETECTED
Benzodiazepines: NOT DETECTED
Cocaine: NOT DETECTED
Opiates: NOT DETECTED
Tetrahydrocannabinol: NOT DETECTED

## 2023-05-19 LAB — CBC WITH DIFFERENTIAL/PLATELET
Abs Immature Granulocytes: 0.04 10*3/uL (ref 0.00–0.07)
Basophils Absolute: 0.1 10*3/uL (ref 0.0–0.1)
Basophils Relative: 1 %
Eosinophils Absolute: 0.2 10*3/uL (ref 0.0–0.5)
Eosinophils Relative: 2 %
HCT: 38.4 % — ABNORMAL LOW (ref 39.0–52.0)
Hemoglobin: 12.7 g/dL — ABNORMAL LOW (ref 13.0–17.0)
Immature Granulocytes: 0 %
Lymphocytes Relative: 24 %
Lymphs Abs: 3 10*3/uL (ref 0.7–4.0)
MCH: 29.8 pg (ref 26.0–34.0)
MCHC: 33.1 g/dL (ref 30.0–36.0)
MCV: 90.1 fL (ref 80.0–100.0)
Monocytes Absolute: 1.1 10*3/uL — ABNORMAL HIGH (ref 0.1–1.0)
Monocytes Relative: 9 %
Neutro Abs: 7.9 10*3/uL — ABNORMAL HIGH (ref 1.7–7.7)
Neutrophils Relative %: 64 %
Platelets: 300 10*3/uL (ref 150–400)
RBC: 4.26 MIL/uL (ref 4.22–5.81)
RDW: 16.3 % — ABNORMAL HIGH (ref 11.5–15.5)
WBC: 12.3 10*3/uL — ABNORMAL HIGH (ref 4.0–10.5)
nRBC: 0 % (ref 0.0–0.2)

## 2023-05-19 LAB — COMPREHENSIVE METABOLIC PANEL WITH GFR
ALT: 19 U/L (ref 0–44)
AST: 23 U/L (ref 15–41)
Albumin: 4.4 g/dL (ref 3.5–5.0)
Alkaline Phosphatase: 61 U/L (ref 38–126)
Anion gap: 11 (ref 5–15)
BUN: 13 mg/dL (ref 6–20)
CO2: 22 mmol/L (ref 22–32)
Calcium: 9.1 mg/dL (ref 8.9–10.3)
Chloride: 105 mmol/L (ref 98–111)
Creatinine, Ser: 1.17 mg/dL (ref 0.61–1.24)
GFR, Estimated: >60 mL/min (ref >60)
Glucose, Bld: 110 mg/dL — ABNORMAL HIGH (ref 70–99)
Potassium: 4 mmol/L (ref 3.5–5.1)
Sodium: 138 mmol/L (ref 135–145)
Total Bilirubin: 0.5 mg/dL (ref 0.3–1.2)
Total Protein: 7.5 g/dL (ref 6.5–8.1)

## 2023-05-19 LAB — LIPASE, BLOOD: Lipase: 35 U/L (ref 11–51)

## 2023-05-19 LAB — POC OCCULT BLOOD, ED: Fecal Occult Bld: NEGATIVE

## 2023-05-19 LAB — TROPONIN I (HIGH SENSITIVITY): Troponin I (High Sensitivity): 4 ng/L (ref <18)

## 2023-05-19 MED ORDER — PANTOPRAZOLE 80MG IVPB - SIMPLE MED
80.0000 mg | Freq: Once | INTRAVENOUS | Status: AC
  Administered 2023-05-19: 80 mg via INTRAVENOUS
  Filled 2023-05-19: qty 80

## 2023-05-19 MED ORDER — SODIUM CHLORIDE 0.9 % IV BOLUS
1000.0000 mL | Freq: Once | INTRAVENOUS | Status: AC
  Administered 2023-05-19: 1000 mL via INTRAVENOUS

## 2023-05-19 NOTE — ED Notes (Signed)
During ortho VS, pt got nausea upon initially lying down. Pt stated, "I believe its because of the medication, but I am not quite sure." Pt felt fine during the sitting portion. Pt became light headed during the standing portion and stated that he pulled a muscle in his leg, so pt had a difficult time standing. We were unable to complete the standing portion of the ortho VS.

## 2023-05-19 NOTE — Discharge Instructions (Signed)
Return for any problem.  ?

## 2023-05-19 NOTE — ED Triage Notes (Signed)
Pt states he was bowling and then walking to the bus stop and could not see, states he lost 70% of his vision. Now states most of his vision has come back and is at 30%. Pt c/o dizziness.

## 2023-05-19 NOTE — ED Provider Notes (Signed)
[place] [facility] AT [facility] Provider Note   CSN: [id number] Arrival date & time: 05/19/23  1401     History {Add pertinent medical, surgical, social history, OB history to HPI:1} Chief Complaint  Patient presents with   Dizziness   Hypotension    Ian Arnold is a 57 y.o. male.  57 year old male with prior medical history as detailed below presents for evaluation.  Patient reports that he had been bowling.  As he was leaving the bowling alley he had a sudden onset of weakness and dizziness.  He felt like he almost passed out.  He was able to get his brother to take him to the ED.  He denies chest pain or shortness of breath.  He denies nausea or vomiting.  He reports that his stool has been somewhat darker over the last 24 hours.  He has a history of GI bleeding.  Patient denies visible blood in his stool.  In triage patient was noted to be hypotensive.  This is improving with IV fluids.  Patient is reporting that he is feeling better with resuscitation.  Patient reports that he has not had any alcohol to drink since his last admission to the hospital.  He is also reporting compliance with previously prescribed medications.    Summary of recent hospitalization in July attached below:  57 y.o. male with medical history significant for thyroid disease, GERD, and alcohol abuse, now presenting with melena and lightheadedness.    GI (Dr. Dulce Sellar) was consulted by the ED physician and the patient was given started on IV Protonix. 2 unit of packed RBC was ordered for transfusion. Assessment and Plan:  Symptomatic anemia/ acute anemia of blood loss: 2 units of prbc transfusion.  Post transfusion H&h stable around 8.4/25.3.  Transfuse to keep hemoglobin greater than 7.  Anemia panel ordered.  GI consulted and  underwent EGD showing Gastritis. Biopsied. - Mucosal changes in the duodenum. - Non- bleeding duodenal ulcers with no stigmata of bleeding. Suspect  NSAID ulcers. Biopsy results pending.  Continue with Oral PPI.    Alcohol abuse:  No signs of withdrawal.    Alcoholic hepatitis:  Mildly elevated liver enzymes.  Repeat liver panel in one week   The history is provided by the patient and medical records.       Home Medications Prior to Admission medications   Medication Sig Start Date End Date Taking? Authorizing Provider  docusate sodium (COLACE) 100 MG capsule Take 100 mg by mouth daily as needed for mild constipation or moderate constipation.    [provider]  folic acid (FOLVITE) 1 MG tablet Take 1 tablet (1 mg) by mouth daily. 03/26/23   Kathlen Mody, MD  LORazepam (ATIVAN) 1 MG tablet Take 1 tablet (1 mg) by mouth every 8 hours as needed for anxiety. 03/26/23   Kathlen Mody, MD  Melatonin 1 MG TABS Take 1 tablet by mouth at bedtime as needed.    [provider]  Multiple Vitamin (MULTIVITAMIN WITH MINERALS) TABS tablet Take 1 tablet by mouth daily. 03/26/23   Kathlen Mody, MD  nicotine (NICODERM CQ - DOSED IN MG/24 HOURS) 21 mg/24hr patch Place 1 patch (21 mg) onto the skin daily. 03/26/23   Kathlen Mody, MD  pantoprazole (PROTONIX) 40 MG tablet Take 1 tablet (40 mg) by mouth 2 times daily before a meal. 03/26/23   Kathlen Mody, MD  thiamine (VITAMIN B-1) 100 MG tablet Take 1 tablet (100 mg) by mouth daily. 03/26/23  Kathlen Mody, MD      Allergies    Measles virus live vaccine    Review of Systems   Review of Systems  All other systems reviewed and are negative.   Physical Exam Updated Vital Signs BP (!) 59/41   Pulse 77   Temp 97.6 F (36.4 C) (Oral)   Resp 18   Ht 5\' 11"  (1.803 m)   Wt 86.6 kg   SpO2 99%   BMI 26.64 kg/m  Physical Exam Vitals and nursing note reviewed.  Constitutional:      General: He is not in acute distress.    Appearance: Normal appearance. He is well-developed.  HENT:     Head: Normocephalic and atraumatic.  Eyes:     Pupils: Pupils are equal, round, and  reactive to light.     Comments: Pale conjunctiva   Cardiovascular:     Rate and Rhythm: Regular rhythm. Tachycardia present.     Heart sounds: Normal heart sounds.  Pulmonary:     Effort: Pulmonary effort is normal. No respiratory distress.     Breath sounds: Normal breath sounds.  Abdominal:     General: There is no distension.     Palpations: Abdomen is soft.     Tenderness: There is no abdominal tenderness.  Genitourinary:    Comments: Yellowish colored stool present on DRE Musculoskeletal:        General: No deformity. Normal range of motion.     Cervical back: Normal range of motion and neck supple.  Skin:    General: Skin is warm and dry.  Neurological:     General: No focal deficit present.     Mental Status: He is alert and oriented to person, place, and time.     ED Results / Procedures / Treatments   Labs (all labs ordered are listed, but only abnormal results are displayed) Labs Reviewed  CBC WITH DIFFERENTIAL/PLATELET  ETHANOL  COMPREHENSIVE METABOLIC PANEL  LIPASE, BLOOD  RAPID URINE DRUG SCREEN, HOSP PERFORMED  URINALYSIS, W/ REFLEX TO CULTURE (INFECTION SUSPECTED)  POC OCCULT BLOOD, ED  TYPE AND SCREEN  TROPONIN I (HIGH SENSITIVITY)    EKG EKG Interpretation Date/Time:  Wednesday May 19 2023 14:23:11 EDT Ventricular Rate:  70 PR Interval:  161 QRS Duration:  96 QT Interval:  410 QTC Calculation: 443 R Axis:   -5  Text Interpretation: Sinus rhythm Confirmed by Kristine Royal [phone]) on 05/19/2023 2:24:26 PM  Radiology No results found.  Procedures Procedures  {Document cardiac monitor, telemetry assessment procedure when appropriate:1}  Medications Ordered in ED Medications  pantoprazole (PROTONIX) 80 mg /NS 100 mL IVPB (has no administration in time range)  sodium chloride 0.9 % bolus 1,000 mL (1,000 mLs Intravenous New Bag/Given 05/19/23 1434)    ED Course/ Medical Decision Making/ A&P   {   Click here for ABCD2, HEART and other  calculatorsREFRESH Note before signing :1}                              Medical Decision Making Amount and/or Complexity of Data Reviewed Labs: ordered. Radiology: ordered.    Medical Screen Complete  This patient presented to the ED with complaint of near syncope.  This complaint involves an extensive number of treatment options. The initial differential diagnosis includes, but is not limited to, anemia, arrhythmia, metabolic abnormality, GI bleed, etc.  This presentation is: Acute, Self-Limited, Previously Undiagnosed, Uncertain Prognosis, Complicated, Systemic Symptoms, and  Threat to Life/Bodily Function  Patient with recent history of GI bleed.  Patient presents today with near syncopal event.  Patient was hypotensive and tachycardic in triage.  Patient improved with IV fluids.  Patient's stool Hemoccult is negative.  Initial hemoglobin is 12.7.  This appears to be close to baseline.  Additional screening labs pending.    Co morbidities that complicated the patient's evaluation  ***   Additional history obtained:  Additional history obtained from {History source:19196::"EMS","Spouse","Family","Friend","Caregiver"} External records from outside sources obtained and reviewed including prior ED visits and prior Inpatient records.    Lab Tests:  I ordered and personally interpreted labs.  The pertinent results include:  ***   Imaging Studies ordered:  I ordered imaging studies including ***  I independently visualized and interpreted obtained imaging which showed *** I agree with the radiologist interpretation.   Cardiac Monitoring:  The patient was maintained on a cardiac monitor.  I personally viewed and interpreted the cardiac monitor which showed an underlying rhythm of: ***   Medicines ordered:  I ordered medication including ***  for ***  Reevaluation of the patient after these medicines showed that the patient:  {resolved/improved/worsened:23923::"improved"}    Test Considered:  ***   Critical Interventions:  ***   Consultations Obtained:  I consulted ***,  and discussed lab and imaging findings as well as pertinent plan of care.    Problem List / ED Course:  ***   Reevaluation:  After the interventions noted above, I reevaluated the patient and found that they have: {resolved/improved/worsened:23923::"improved"}   Social Determinants of Health:  ***   Disposition:  After consideration of the diagnostic results and the patients response to treatment, I feel that the patent would benefit from ***.    {Document critical care time when appropriate:1} {Document review of labs and clinical decision tools ie heart score, Chads2Vasc2 etc:1}  {Document your independent review of radiology images, and any outside records:1} {Document your discussion with family members, caretakers, and with consultants:1} {Document social determinants of health affecting pt's care:1} {Document your decision making why or why not admission, treatments were needed:1} Final Clinical Impression(s) / ED Diagnoses Final diagnoses:  None    Rx / DC Orders ED Discharge Orders     None

## 2023-05-20 ENCOUNTER — Other Ambulatory Visit (HOSPITAL_COMMUNITY): Payer: Self-pay

## 2023-05-31 ENCOUNTER — Other Ambulatory Visit (HOSPITAL_COMMUNITY): Payer: Self-pay

## 2023-06-18 ENCOUNTER — Other Ambulatory Visit (HOSPITAL_COMMUNITY): Payer: Self-pay

## 2023-06-21 ENCOUNTER — Other Ambulatory Visit (HOSPITAL_COMMUNITY): Payer: Self-pay

## 2023-07-30 IMAGING — DX DG CHEST 1V PORT
2 series · 2 of 2 positions shown · non-contrast
Comparison: None.

CLINICAL DATA: Cough, congestion and fever, with decreased p.o.
intake.

EXAM:
PORTABLE CHEST 1 VIEW

[chest ap (1 of 2)]
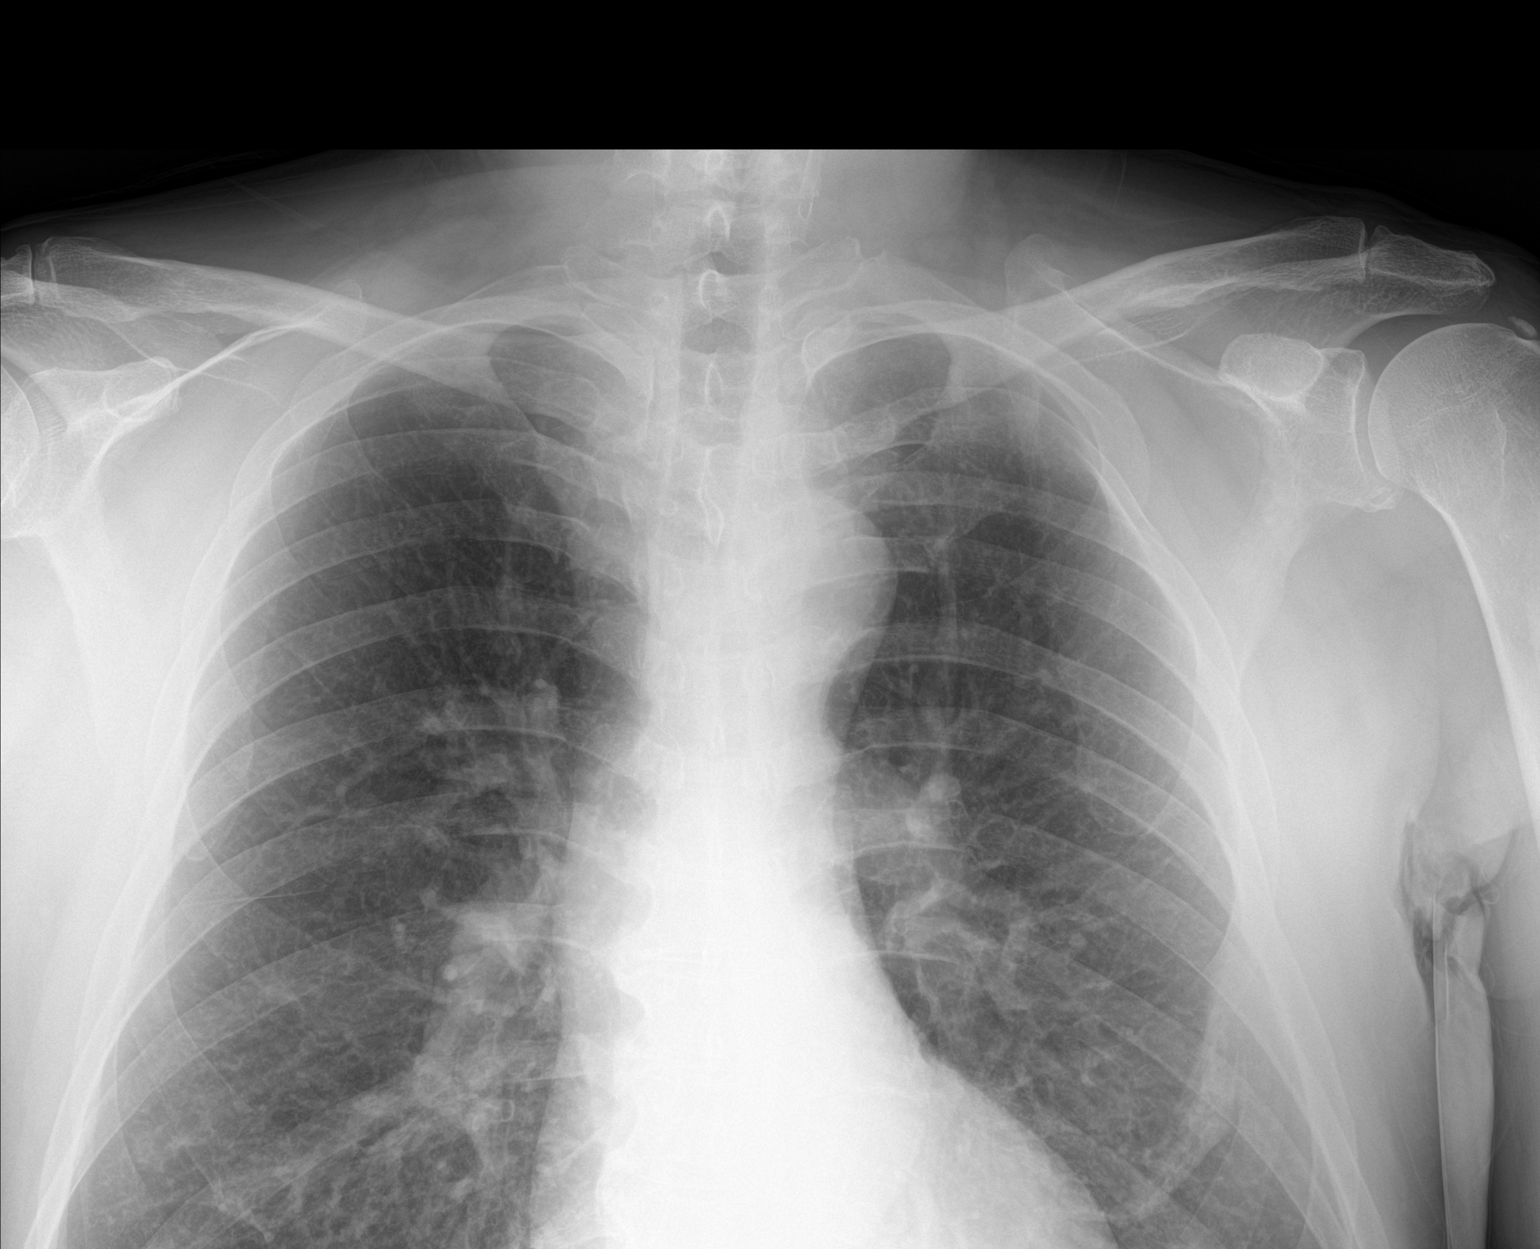

[chest ap (2 of 2)]
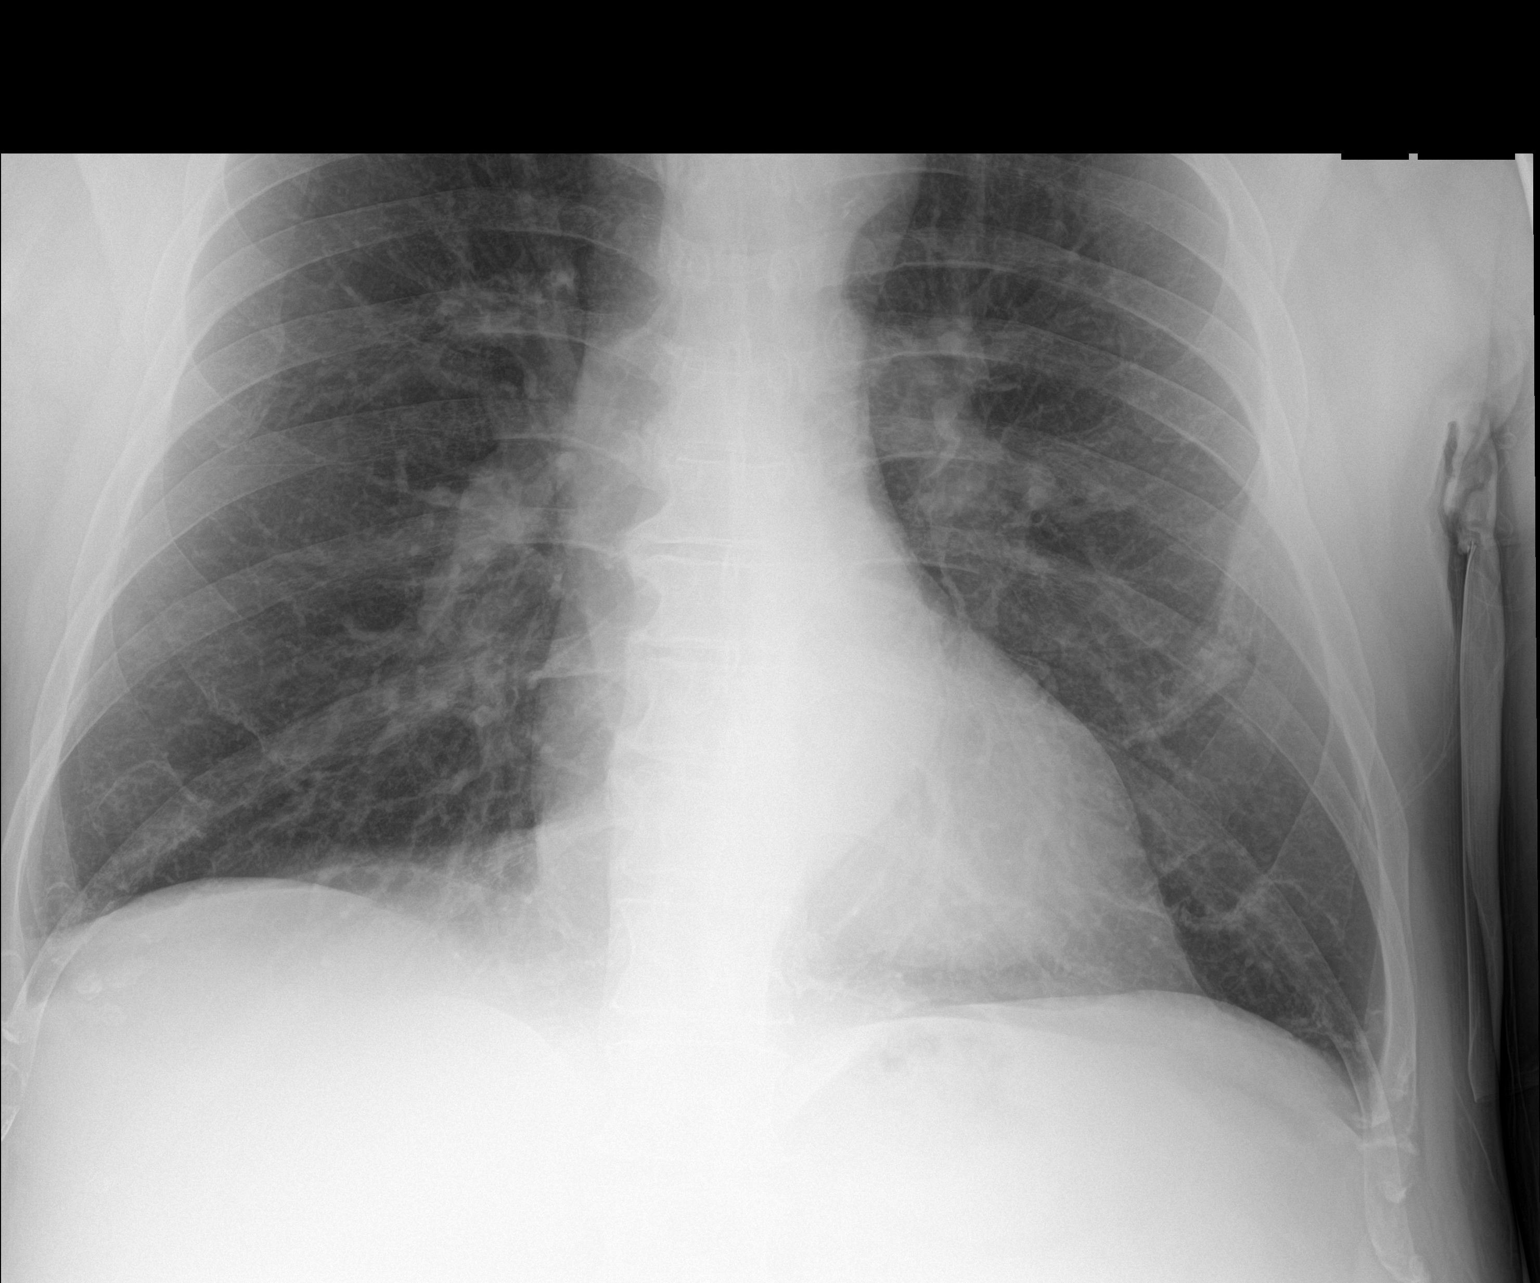

[2 of 2 positions shown; findings below may reference images not displayed]

FINDINGS: The cardiac size is normal. There is aortic tortuosity with
calcification of the transverse segment, otherwise unremarkable
mediastinal configuration.

No vascular congestion is seen. No significant pleural effusion is
evident. The lungs hyperinflated but clear. Thoracic spondylosis.
IMPRESSION: 1. Hyperinflated chest with no acute cardiopulmonary findings.
2. Aortic atherosclerosis and uncoiling. Correlate clinically for
untreated hypertension.

## 2023-12-08 ENCOUNTER — Encounter (HOSPITAL_COMMUNITY): Payer: Self-pay

## 2023-12-08 ENCOUNTER — Other Ambulatory Visit: Payer: Self-pay

## 2023-12-08 ENCOUNTER — Emergency Department (HOSPITAL_COMMUNITY)
Admission: EM | Admit: 2023-12-08 | Discharge: 2023-12-08 | Disposition: A | Discharge status: Home / Self Care | Attending: Emergency Medicine | Admitting: Emergency Medicine

## 2023-12-08 DIAGNOSIS — E039 Hypothyroidism, unspecified: Secondary | ICD-10-CM | POA: Diagnosis not present

## 2023-12-08 DIAGNOSIS — F17210 Nicotine dependence, cigarettes, uncomplicated: Secondary | ICD-10-CM | POA: Diagnosis not present

## 2023-12-08 DIAGNOSIS — R197 Diarrhea, unspecified: Secondary | ICD-10-CM | POA: Diagnosis not present

## 2023-12-08 DIAGNOSIS — R112 Nausea with vomiting, unspecified: Secondary | ICD-10-CM | POA: Insufficient documentation

## 2023-12-08 LAB — COMPREHENSIVE METABOLIC PANEL WITH GFR
ALT: 18 U/L (ref 0–44)
AST: 25 U/L (ref 15–41)
Albumin: 4.3 g/dL (ref 3.5–5.0)
Alkaline Phosphatase: 60 U/L (ref 38–126)
Anion gap: 10 (ref 5–15)
BUN: 19 mg/dL (ref 6–20)
CO2: 23 mmol/L (ref 22–32)
Calcium: 8.8 mg/dL — ABNORMAL LOW (ref 8.9–10.3)
Chloride: 103 mmol/L (ref 98–111)
Creatinine, Ser: 0.81 mg/dL (ref 0.61–1.24)
GFR, Estimated: >60 mL/min (ref >60)
Glucose, Bld: 112 mg/dL — ABNORMAL HIGH (ref 70–99)
Potassium: 3.9 mmol/L (ref 3.5–5.1)
Sodium: 136 mmol/L (ref 135–145)
Total Bilirubin: 0.6 mg/dL (ref 0.0–1.2)
Total Protein: 7.5 g/dL (ref 6.5–8.1)

## 2023-12-08 LAB — CBC
HCT: 46.7 % (ref 39.0–52.0)
Hemoglobin: 15.8 g/dL (ref 13.0–17.0)
MCH: 32.1 pg (ref 26.0–34.0)
MCHC: 33.8 g/dL (ref 30.0–36.0)
MCV: 94.9 fL (ref 80.0–100.0)
Platelets: 248 K/uL (ref 150–400)
RBC: 4.92 MIL/uL (ref 4.22–5.81)
RDW: 13.7 % (ref 11.5–15.5)
WBC: 12 K/uL — ABNORMAL HIGH (ref 4.0–10.5)
nRBC: 0 % (ref 0.0–0.2)

## 2023-12-08 LAB — LIPASE, BLOOD: Lipase: 29 U/L (ref 11–51)

## 2023-12-08 MED ORDER — LOPERAMIDE HCL 2 MG PO CAPS: 2.0000 mg | ORAL_CAPSULE | Freq: Four times a day (QID) | ORAL | 0 refills | Status: AC | PRN

## 2023-12-08 MED ORDER — ONDANSETRON 4 MG PO TBDP: 4.0000 mg | ORAL_TABLET | Freq: Three times a day (TID) | ORAL | 0 refills | Status: AC | PRN

## 2023-12-08 MED ORDER — ONDANSETRON 8 MG PO TBDP
8.0000 mg | ORAL_TABLET | Freq: Once | ORAL | Status: AC
  Administered 2023-12-08: 8 mg via ORAL
  Filled 2023-12-08: qty 1

## 2023-12-08 NOTE — Discharge Instructions (Addendum)
 We evaluated you for your nausea, vomiting and diarrhea.  Your testing in the emergency department is reassuring.  Your symptoms are likely due to a viral illness or food poisoning.  Please be sure to drink lots of fluids.  We have given you a medicine called Zofran which you can take up to 3 times daily as needed for nausea and vomiting.  We have also given you a medicine called Imodium which you can take up to 4 times daily for diarrhea.  Please follow-up with your primary doctor.  Please return if you develop any new or worsening symptoms such as severe worsening abdominal pain, uncontrolled vomiting, large amounts of blood in your stool, or any other concerning symptoms.

## 2023-12-08 NOTE — ED Provider Notes (Signed)
 [place] [facility] AT [facility] Provider Note  CSN: [id number] Arrival date & time: 12/08/23 1352  Chief Complaint(s) Diarrhea, Emesis, and Abdominal Cramping  HPI Ian Arnold is a 58 y.o. male history of GERD, prior episode of GI bleed presenting to the emergency department with nausea, vomiting, diarrhea.  He reports symptoms began yesterday.  Reports associated abdominal cramping which improves after having vomiting or diarrhea.  He reports that he had some small amount of blood when wiping but attributes this to numerous episodes of diarrhea.  No blood mixed with stool.  No hematemesis.  Reports some subjective fevers and chills.  Some at work was sick with diarrhea.  No recent travel, antibiotic use.   Past Medical History Past Medical History:  Diagnosis Date   GERD (gastroesophageal reflux disease)    tums   Hypothyroidism    has never been on medication   Thyroid disease    Patient Active Problem List   Diagnosis Date Noted   Malnutrition of moderate degree 03/25/2023   Upper GI bleed 03/23/2023   Acute blood loss anemia 03/23/2023   Alcohol abuse 03/23/2023   Right inguinal hernia 07/07/2012   Home Medication(s) Prior to Admission medications   Medication Sig Start Date End Date Taking? Authorizing Provider  loperamide (IMODIUM) 2 MG capsule Take 1 capsule (2 mg total) by mouth 4 (four) times daily as needed for diarrhea or loose stools. 12/08/23  Yes Lonell Grandchild, MD  ondansetron (ZOFRAN-ODT) 4 MG disintegrating tablet Take 1 tablet (4 mg total) by mouth every 8 (eight) hours as needed for nausea or vomiting. 12/08/23  Yes Lonell Grandchild, MD  docusate sodium (COLACE) 100 MG capsule Take 100 mg by mouth daily as needed for mild constipation or moderate constipation.    [provider]  folic acid (FOLVITE) 1 MG tablet Take 1 tablet (1 mg) by mouth daily. 03/26/23   Kathlen Mody, MD  LORazepam (ATIVAN) 1 MG tablet Take 1 tablet (1  mg) by mouth every 8 hours as needed for anxiety. 03/26/23   Kathlen Mody, MD  Melatonin 1 MG TABS Take 1 tablet by mouth at bedtime as needed.    [provider]  Multiple Vitamin (MULTIVITAMIN WITH MINERALS) TABS tablet Take 1 tablet by mouth daily. 03/26/23   Kathlen Mody, MD  nicotine (NICODERM CQ - DOSED IN MG/24 HOURS) 21 mg/24hr patch Place 1 patch (21 mg) onto the skin daily. 03/26/23   Kathlen Mody, MD  pantoprazole (PROTONIX) 40 MG tablet Take 1 tablet (40 mg) by mouth 2 times daily before a meal. 03/26/23   Kathlen Mody, MD  thiamine (VITAMIN B-1) 100 MG tablet Take 1 tablet (100 mg) by mouth daily. 03/26/23   Kathlen Mody, MD  Past Surgical History Past Surgical History:  Procedure Laterality Date   BIOPSY  03/24/2023   Procedure: BIOPSY;  Surgeon: Willis Modena, MD;  Location: [facility] END[facility]COPY;  Service: Gastroenterology;;   ESOPHAGOGASTRODUODENOSCOPY N/A 03/24/2023   Procedure: ESOPHAGOGASTRODUODENOSCOPY (EGD);  Surgeon: Willis Modena, MD;  Location: [facility];  Service: Gastroenterology;  Laterality: N/A;   HERNIA REPAIR     INGUINAL HERNIA REPAIR  07/18/2012   Procedure: HERNIA REPAIR INGUINAL ADULT;  Surgeon: Robyne Askew, MD;  Location: [facility] OR;  Service: General;  Laterality: Right;   INSERTION OF MESH  07/18/2012   Procedure: INSERTION OF MESH;  Surgeon: Robyne Askew, MD;  Location: [facility] OR;  Service: General;  Laterality: N/A;   KNEE ARTHROSCOPY     KNEE RECONSTRUCTION     RECONSTRUCTION OF NOSE     Family History History reviewed. No pertinent family history.  Social History Social History   Tobacco Use   Smoking status: Every Day    Current packs/day: 0.00    Average packs/day: 1.5 packs/day for 14.0 years (21.0 ttl pk-yrs)    Types: Cigarettes    Start date: 08/30/1994    Last attempt to quit: 08/31/1994    Years since  quitting: 29.2   Smokeless tobacco: Never  Substance Use Topics   Alcohol use: Yes   Drug use: No   Allergies Measles virus live vaccine  Review of Systems Review of Systems  All other systems reviewed and are negative.   Physical Exam Vital Signs  I have reviewed the triage vital signs BP (!) 111/91 (BP Location: Right Arm)   Pulse 98   Temp 98.5 F (36.9 C) (Oral)   Resp 18   Ht 5\' 11"  (1.803 m)   Wt 86.6 kg   SpO2 97%   BMI 26.64 kg/m  Physical Exam Vitals and nursing note reviewed.  Constitutional:      General: He is not in acute distress.    Appearance: Normal appearance.  HENT:     Mouth/Throat:     Mouth: Mucous membranes are moist.  Eyes:     Conjunctiva/sclera: Conjunctivae normal.  Cardiovascular:     Rate and Rhythm: Normal rate and regular rhythm.  Pulmonary:     Effort: Pulmonary effort is normal. No respiratory distress.     Breath sounds: Normal breath sounds.  Abdominal:     General: Abdomen is flat.     Palpations: Abdomen is soft.     Tenderness: There is no abdominal tenderness.  Musculoskeletal:     Right lower leg: No edema.     Left lower leg: No edema.  Skin:    General: Skin is warm and dry.     Capillary Refill: Capillary refill takes less than 2 seconds.  Neurological:     Mental Status: He is alert and oriented to person, place, and time. Mental status is at baseline.  Psychiatric:        Mood and Affect: Mood normal.        Behavior: Behavior normal.     ED Results and Treatments Labs (all labs ordered are listed, but only abnormal results are displayed) Labs Reviewed  COMPREHENSIVE METABOLIC PANEL WITH GFR - Abnormal; Notable for the following components:      Result Value   Glucose, Bld 112 (*)    Calcium 8.8 (*)    All other components within normal limits  CBC - Abnormal; Notable for the following components:   WBC 12.0 (*)  All other components within normal limits  LIPASE, BLOOD  URINALYSIS, ROUTINE W REFLEX  MICROSCOPIC                                                                                                                          Radiology No results found.  Pertinent labs & imaging results that were available during my care of the patient were reviewed by me and considered in my medical decision making (see MDM for details).  Medications Ordered in ED Medications  ondansetron (ZOFRAN-ODT) disintegrating tablet 8 mg (has no administration in time range)                                                                                                                                     Procedures Procedures  (including critical care time)  Medical Decision Making / ED Course   MDM:  58 year old presenting to the emergency department with diarrhea, nausea and vomiting.  Patient well-appearing, physical examination with no abdominal tenderness.  Vital signs are reassuring.  No hypotension, tachycardia.  Does not appear clinically dehydrated.  Suspect viral illness, differential also includes food poisoning or foodborne illness.  Low concern for any invasive bacterial diarrhea, he reports some small amount of blood in his toilet paper with wiping but no blood in the stool.  No hematemesis.  Does have history of GI bleed but denies melena and his hemoglobin is normal.  No recent travel to suggest traveler's diarrhea.  No recent antibiotic use to suggest C. difficile.  No abdominal tenderness to suggest any focal intra-abdominal process such as colitis, diverticulitis, perforation, obstruction, appendicitis.  Will treat symptomatically with Zofran, Imodium. Will discharge patient to home. All questions answered. Patient comfortable with plan of discharge. Return precautions discussed with patient and specified on the after visit summary.       Lab Tests: -I ordered, reviewed, and interpreted labs.   The pertinent results include:   Labs Reviewed  COMPREHENSIVE METABOLIC PANEL WITH  GFR - Abnormal; Notable for the following components:      Result Value   Glucose, Bld 112 (*)    Calcium 8.8 (*)    All other components within normal limits  CBC - Abnormal; Notable for the following components:   WBC 12.0 (*)    All other components within normal limits  LIPASE, BLOOD  URINALYSIS, ROUTINE W REFLEX MICROSCOPIC  Notable for no AKI or anemia    Medicines ordered and prescription drug management: Meds ordered this encounter  Medications   ondansetron (ZOFRAN-ODT) disintegrating tablet 8 mg   ondansetron (ZOFRAN-ODT) 4 MG disintegrating tablet    Sig: Take 1 tablet (4 mg total) by mouth every 8 (eight) hours as needed for nausea or vomiting.    Dispense:  20 tablet    Refill:  0   loperamide (IMODIUM) 2 MG capsule    Sig: Take 1 capsule (2 mg total) by mouth 4 (four) times daily as needed for diarrhea or loose stools.    Dispense:  12 capsule    Refill:  0    -I have reviewed the patients home medicines and have made adjustments as needed   Social Determinants of Health:  Diagnosis or treatment significantly limited by social determinants of health: alcohol use   Reevaluation: After the interventions noted above, I reevaluated the patient and found that their symptoms have improved  Co morbidities that complicate the patient evaluation  Past Medical History:  Diagnosis Date   GERD (gastroesophageal reflux disease)    tums   Hypothyroidism    has never been on medication   Thyroid disease       Dispostion: Disposition decision including need for hospitalization was considered, and patient discharged from emergency department.    Final Clinical Impression(s) / ED Diagnoses Final diagnoses:  Nausea vomiting and diarrhea     This chart was dictated using voice recognition software.  Despite best efforts to proofread,  errors can occur which can change the documentation meaning.    Lonell Grandchild, MD 12/08/23 1754

## 2023-12-08 NOTE — ED Triage Notes (Signed)
 Pt arrived reporting diarrhea, nausea and intermittent cramping x1 day. Reports red streak seen in stool. Denies recent antibiotic use. No other complaints.

## 2024-10-27 ENCOUNTER — Ambulatory Visit: Admitting: Family Medicine
